# Patient Record
Sex: Female | Born: 1955 | Race: White | Hispanic: Yes | Marital: Married | Smoking: Never smoker
Health system: Southern US, Community
[De-identification: ages and names within clinical notes are randomized; demographics above are authoritative.]

## PROBLEM LIST (undated history)

## (undated) DIAGNOSIS — E781 Pure hyperglyceridemia: Secondary | ICD-10-CM

## (undated) DIAGNOSIS — R079 Chest pain, unspecified: Secondary | ICD-10-CM

## (undated) DIAGNOSIS — E782 Mixed hyperlipidemia: Secondary | ICD-10-CM

## (undated) HISTORY — PX: ABDOMINAL HYSTERECTOMY: SHX81

## (undated) HISTORY — DX: Mixed hyperlipidemia: E78.2

## (undated) HISTORY — DX: Chest pain, unspecified: R07.9

## (undated) HISTORY — DX: Pure hyperglyceridemia: E78.1

## (undated) HISTORY — PX: CHOLECYSTECTOMY: SHX55

---

## 1997-11-16 ENCOUNTER — Other Ambulatory Visit: Admission: RE | Admit: 1997-11-16 | Discharge: 1997-11-16 | Payer: Self-pay | Admitting: Gynecology

## 1998-05-19 ENCOUNTER — Inpatient Hospital Stay (HOSPITAL_COMMUNITY): Admission: AD | Admit: 1998-05-19 | Discharge: 1998-05-22 | Payer: Self-pay | Admitting: Gynecology

## 1998-06-29 ENCOUNTER — Other Ambulatory Visit: Admission: RE | Admit: 1998-06-29 | Discharge: 1998-06-29 | Payer: Self-pay | Admitting: Gynecology

## 1999-01-06 ENCOUNTER — Encounter: Payer: Self-pay | Admitting: Gynecology

## 1999-01-11 ENCOUNTER — Encounter (INDEPENDENT_AMBULATORY_CARE_PROVIDER_SITE_OTHER): Payer: Self-pay

## 1999-01-11 ENCOUNTER — Inpatient Hospital Stay (HOSPITAL_COMMUNITY): Admission: RE | Admit: 1999-01-11 | Discharge: 1999-01-14 | Payer: Self-pay | Admitting: Gynecology

## 1999-01-11 ENCOUNTER — Encounter (INDEPENDENT_AMBULATORY_CARE_PROVIDER_SITE_OTHER): Payer: Self-pay | Admitting: Specialist

## 2000-03-25 ENCOUNTER — Other Ambulatory Visit: Admission: RE | Admit: 2000-03-25 | Discharge: 2000-03-25 | Payer: Self-pay | Admitting: Gynecology

## 2001-08-06 ENCOUNTER — Other Ambulatory Visit: Admission: RE | Admit: 2001-08-06 | Discharge: 2001-08-06 | Payer: Self-pay | Admitting: Gynecology

## 2001-08-07 ENCOUNTER — Encounter: Admission: RE | Admit: 2001-08-07 | Discharge: 2001-08-07 | Payer: Self-pay | Admitting: Gynecology

## 2001-08-07 ENCOUNTER — Encounter: Payer: Self-pay | Admitting: Gynecology

## 2002-08-11 ENCOUNTER — Other Ambulatory Visit: Admission: RE | Admit: 2002-08-11 | Discharge: 2002-08-11 | Payer: Self-pay | Admitting: Gynecology

## 2003-03-25 ENCOUNTER — Other Ambulatory Visit: Admission: RE | Admit: 2003-03-25 | Discharge: 2003-03-25 | Payer: Self-pay | Admitting: Internal Medicine

## 2003-11-25 ENCOUNTER — Other Ambulatory Visit: Admission: RE | Admit: 2003-11-25 | Discharge: 2003-11-25 | Payer: Self-pay | Admitting: Gynecology

## 2004-09-12 ENCOUNTER — Ambulatory Visit (HOSPITAL_COMMUNITY): Admission: RE | Admit: 2004-09-12 | Discharge: 2004-09-12 | Payer: Self-pay | Admitting: Gastroenterology

## 2004-09-12 ENCOUNTER — Encounter (INDEPENDENT_AMBULATORY_CARE_PROVIDER_SITE_OTHER): Payer: Self-pay | Admitting: *Deleted

## 2010-01-01 ENCOUNTER — Emergency Department (HOSPITAL_COMMUNITY): Admission: EM | Admit: 2010-01-01 | Discharge: 2010-01-01 | Payer: Self-pay | Admitting: Emergency Medicine

## 2010-01-31 ENCOUNTER — Ambulatory Visit (HOSPITAL_COMMUNITY): Admission: RE | Admit: 2010-01-31 | Discharge: 2010-01-31 | Payer: Self-pay | Admitting: Surgery

## 2010-07-06 LAB — CBC
Platelets: 220 10*3/uL (ref 150–400)
RBC: 4.71 MIL/uL (ref 3.87–5.11)
RDW: 13.1 % (ref 11.5–15.5)
WBC: 6.9 10*3/uL (ref 4.0–10.5)

## 2010-07-06 LAB — COMPREHENSIVE METABOLIC PANEL
AST: 27 U/L (ref 0–37)
Albumin: 4.2 g/dL (ref 3.5–5.2)
Alkaline Phosphatase: 74 U/L (ref 39–117)
Chloride: 108 mEq/L (ref 96–112)
GFR calc Af Amer: >60 mL/min (ref >60)
Potassium: 3.5 mEq/L (ref 3.5–5.1)
Total Bilirubin: 1.4 mg/dL — ABNORMAL HIGH (ref 0.3–1.2)
Total Protein: 7.9 g/dL (ref 6.0–8.3)

## 2010-07-06 LAB — URINE MICROSCOPIC-ADD ON

## 2010-07-06 LAB — URINALYSIS, ROUTINE W REFLEX MICROSCOPIC
Glucose, UA: NEGATIVE mg/dL
Leukocytes, UA: NEGATIVE
Specific Gravity, Urine: 1.003 — ABNORMAL LOW (ref 1.005–1.030)
pH: 6.5 (ref 5.0–8.0)

## 2010-07-06 LAB — SURGICAL PCR SCREEN: Staphylococcus aureus: INVALID — AB

## 2010-07-06 LAB — MRSA CULTURE

## 2010-07-06 LAB — DIFFERENTIAL
Basophils Absolute: 0.1 10*3/uL (ref 0.0–0.1)
Basophils Relative: 1 % (ref 0–1)
Eosinophils Relative: 2 % (ref 0–5)
Lymphocytes Relative: 54 % — ABNORMAL HIGH (ref 12–46)
Monocytes Absolute: 0.7 10*3/uL (ref 0.1–1.0)
Monocytes Relative: 10 % (ref 3–12)
Neutro Abs: 2.3 10*3/uL (ref 1.7–7.7)

## 2011-07-06 IMAGING — US US ABDOMEN COMPLETE
1 series · 13 of 25 positions shown · non-contrast
Comparison: None

CLINICAL DATA: Abdominal pain.

ABDOMINAL ULTRASOUND COMPLETE

[Series 1: us abdomen complete · 0.30mm/px · 13 of 77 slices shown]
[im 1/77]
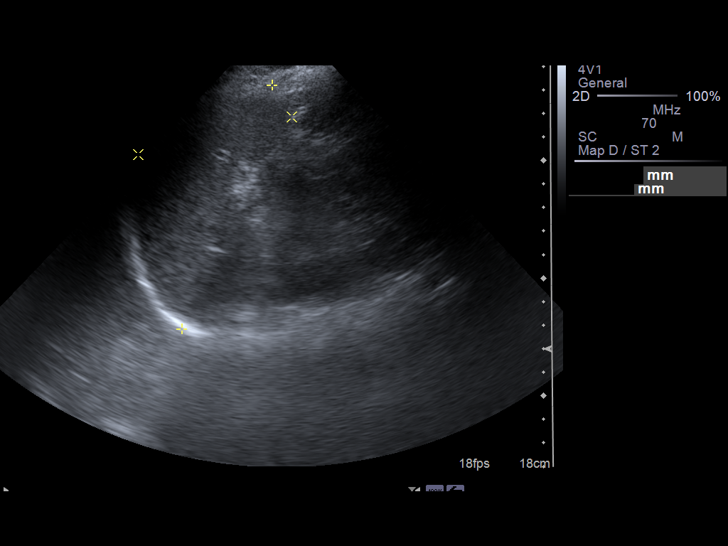
[im 7/77]
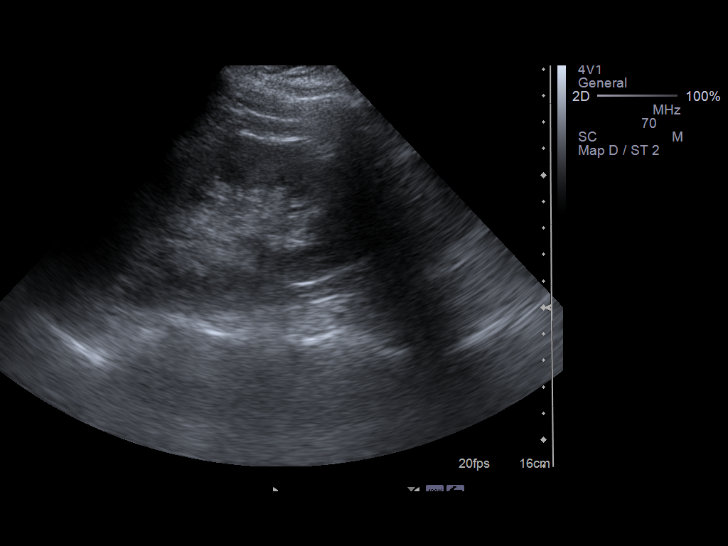
[im 13/77]
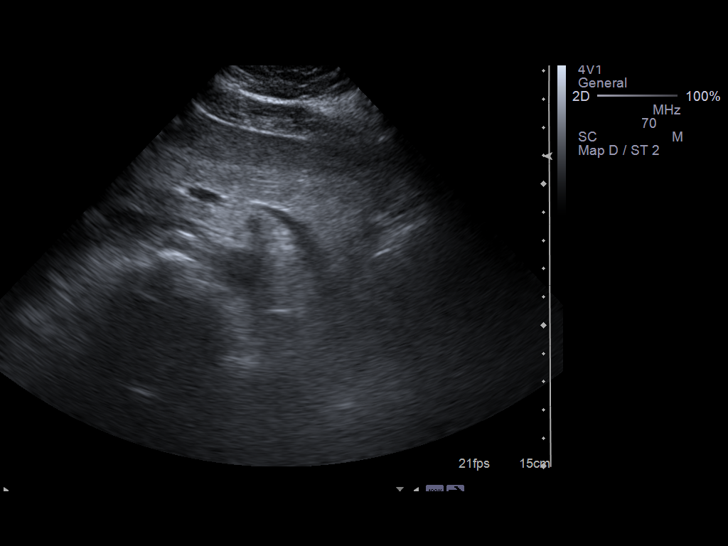
[im 20/77]
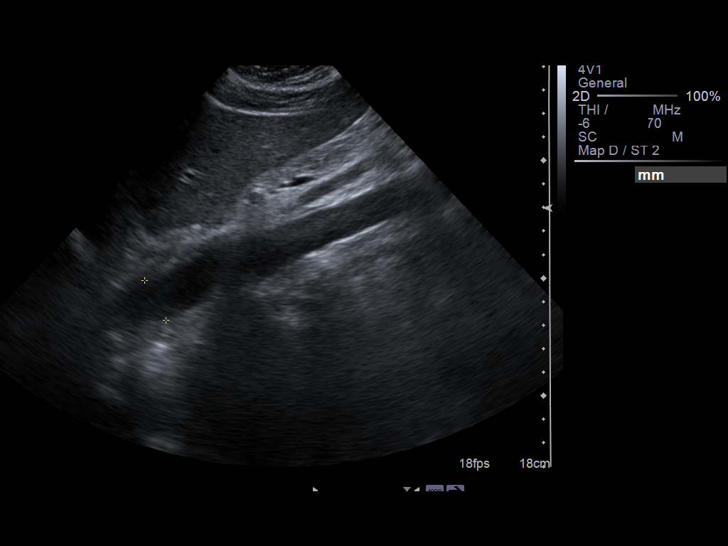
[im 26/77]
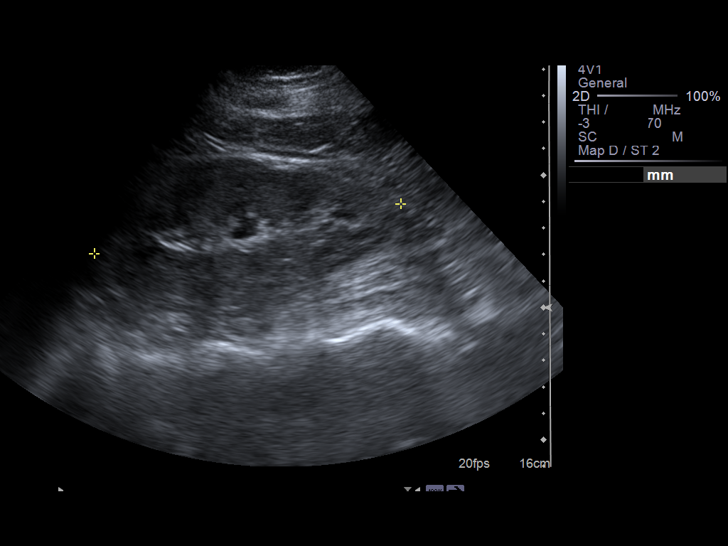
[im 32/77]
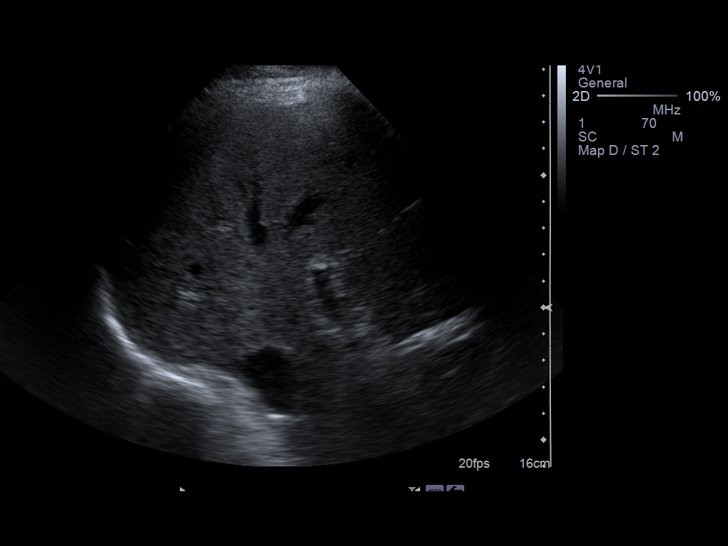
[im 39/77]
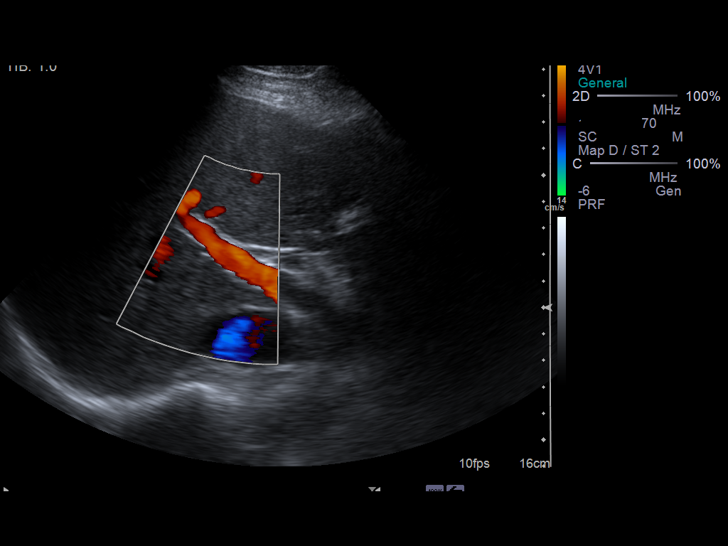
[im 45/77]
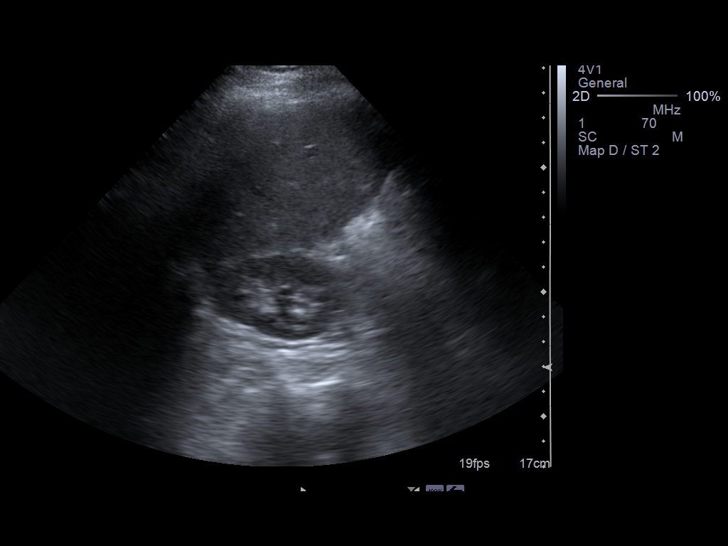
[im 51/77]
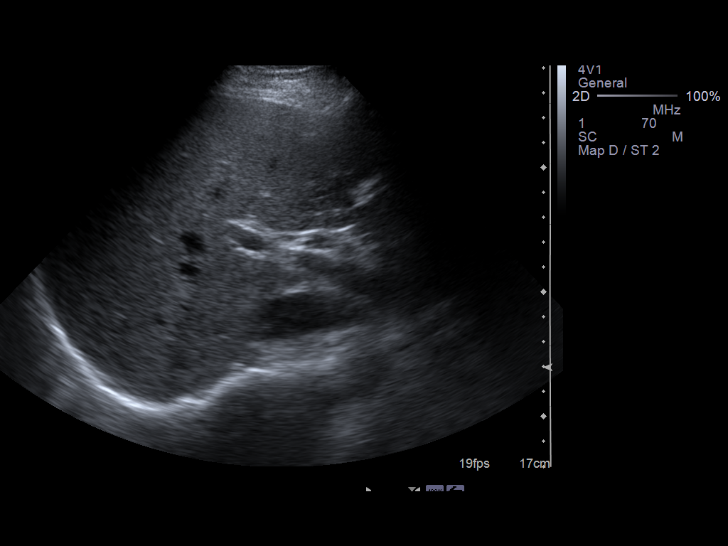
[im 58/77]
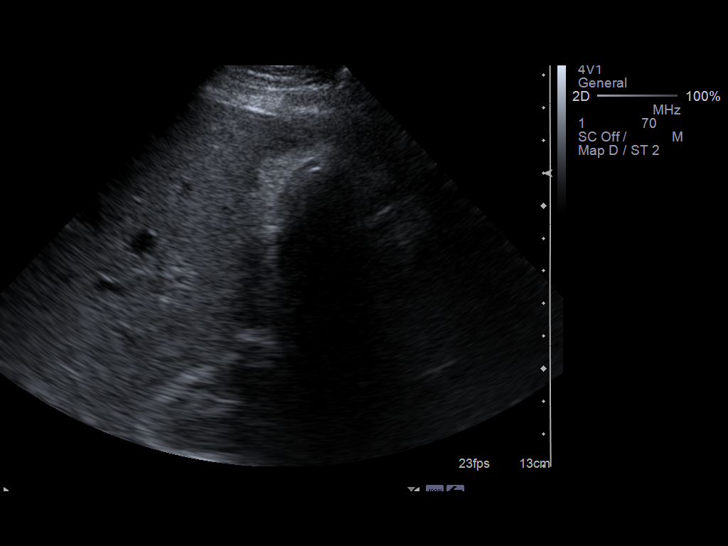
[im 64/77]
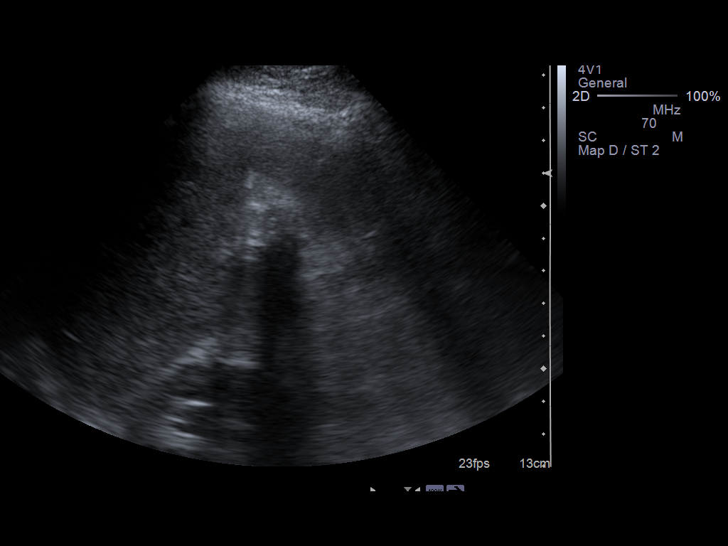
[im 70/77]
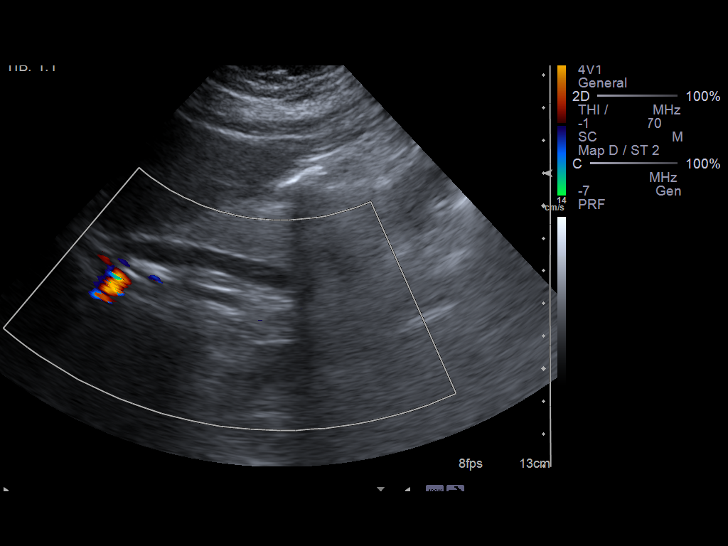
[im 77/77]
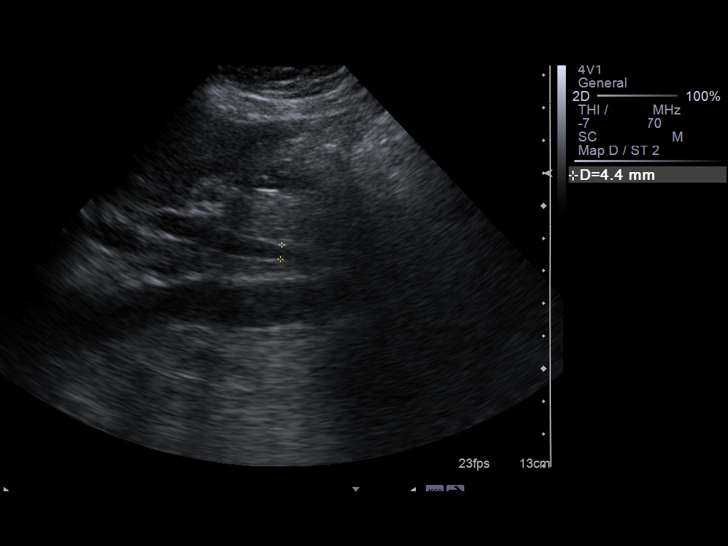

[13 of 25 positions shown; findings below may reference images not displayed]

FINDINGS: Gallbladder:  The gallbladder is filled with stones. Upper limits
normal gallbladder wall thickness is identified.  There is no
evidence of sonographic Murphy's sign or pericholecystic fluid.

Common Bile Duct:  There is no evidence of intrahepatic or
extrahepatic biliary dilation. The CBD measures 5.1 mm in greatest
diameter.

Liver:  The liver is within normal limits in parenchymal
echogenicity. No focal abnormalities are identified.

IVC:  Appears normal.

Pancreas:  Although the pancreas is difficult to visualize in its
entirety, no focal pancreatic abnormality is identified.

Spleen:  Within normal limits in size and echotexture.

Right kidney:  The right kidney is normal in size and parenchymal
echogenicity.  There is no evidence of solid mass, hydronephrosis
or definite renal calculi.  The right kidney measures 11.7 cm.

Left kidney:  The left kidney is normal in size and parenchymal
echogenicity.  There is no evidence of solid mass, hydronephrosis
or definite renal calculi.   The left kidney measures 10.5 cm.

Abdominal Aorta:  No abdominal aortic aneurysm identified.

There is no evidence of ascites.
IMPRESSION: Cholelithiasis with upper limits normal gallbladder wall thickness
- equivocal for acute cholecystitis but there is no evidence of
sonographic Murphy's sign or pericholecystic fluid.  If there is
strong clinical suspicion for acute cholecystitis, consider nuclear
medicine study.

## 2011-08-05 IMAGING — RF DG CHOLANGIOGRAM OPERATIVE
1 series · 4 of 4 positions shown · non-contrast
Comparison: None

CLINICAL DATA: Cholelithiasis

INTRAOPERATIVE CHOLANGIOGRAM
TECHNIQUE: Cholangiographic images from the C-arm fluoroscopic
device were submitted for interpretation post-operatively.  Please
see the procedural report for the amount of contrast and the
fluoroscopy time utilized.

[Series 1: run · 4 of 137 frames shown]
[frame 21/137]
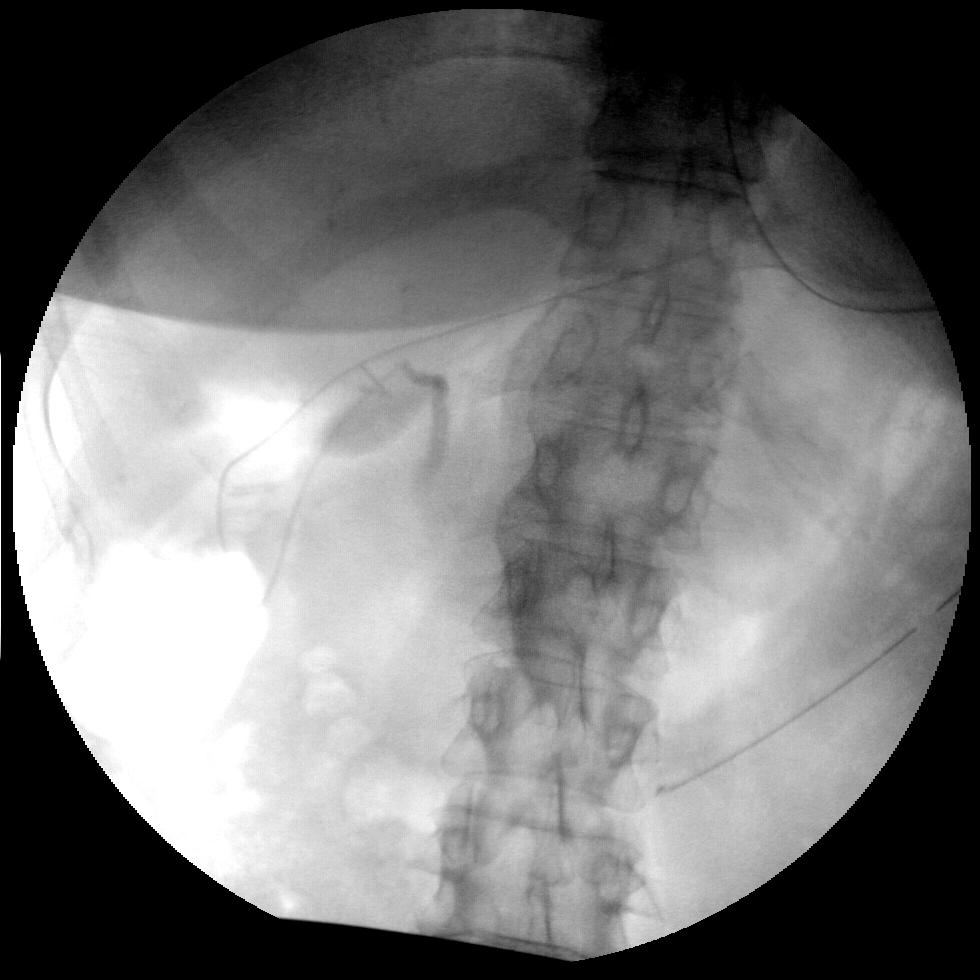
[frame 69/137]
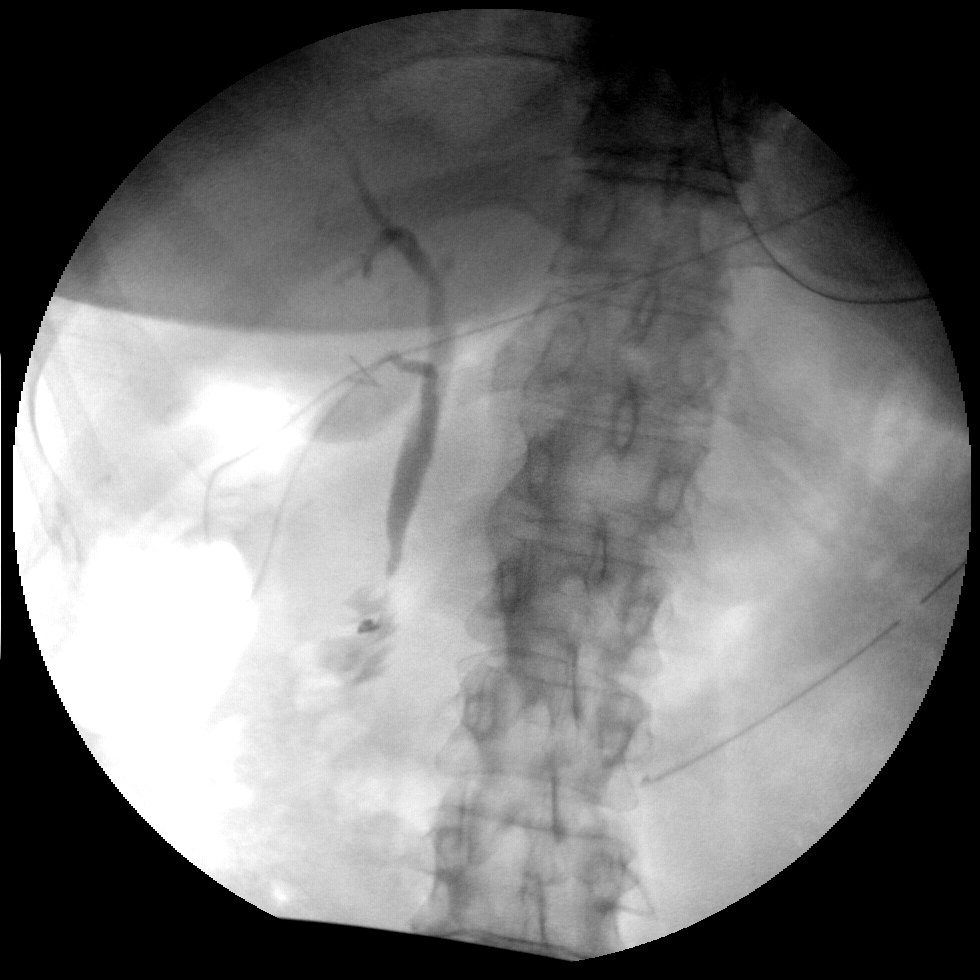
[frame 79/137]
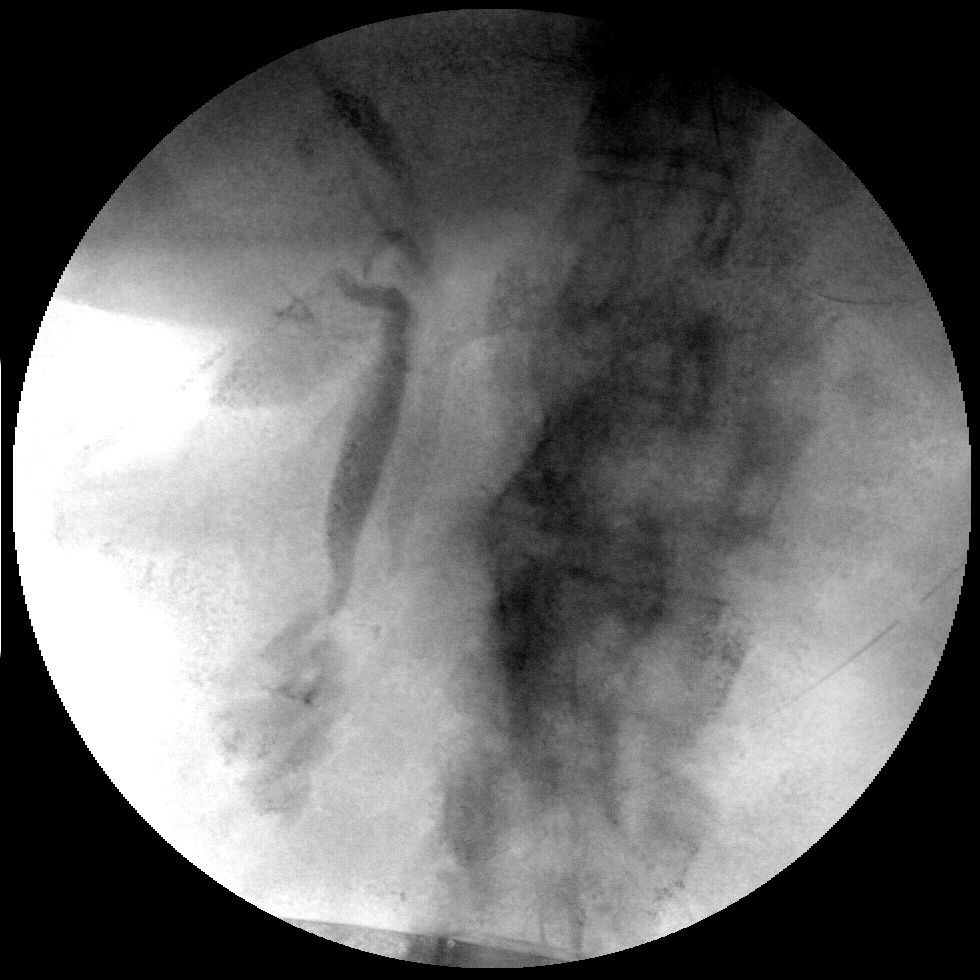
[frame 117/137]
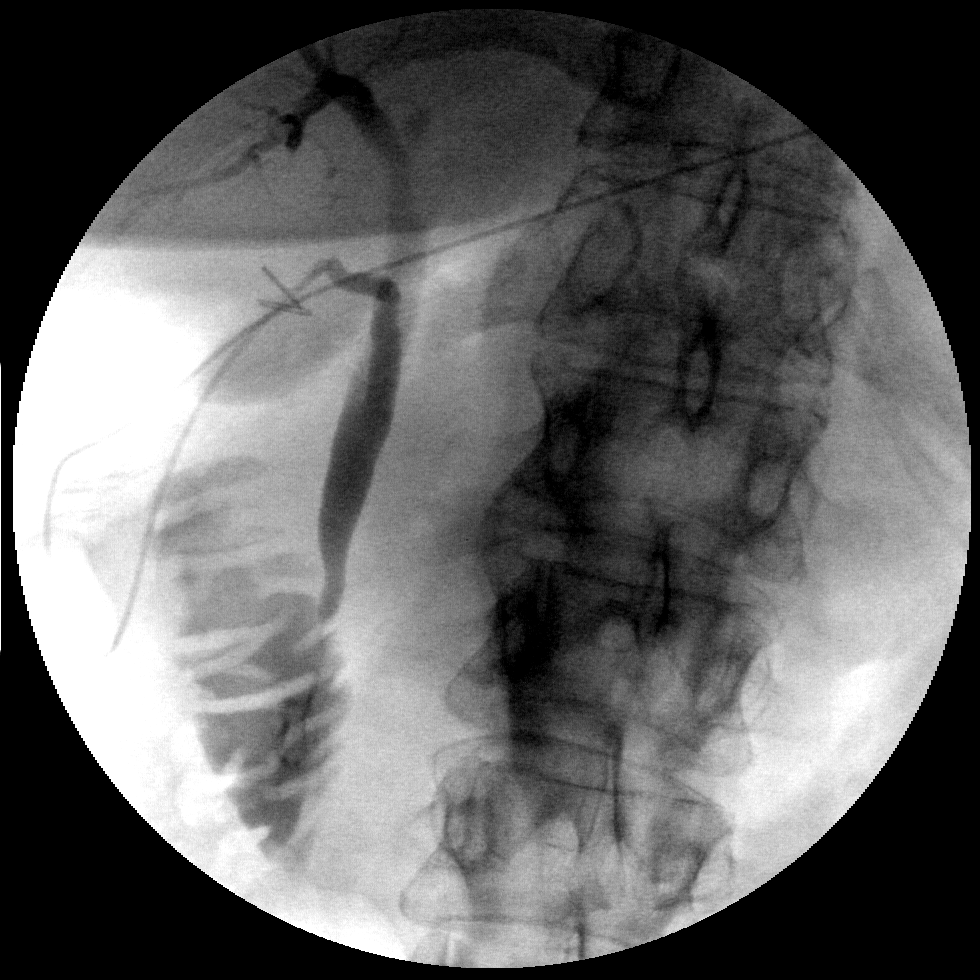

[4 of 4 positions shown; findings below may reference images not displayed]

FINDINGS: No persistent filling defects in the common duct.
Intrahepatic ducts are incompletely visualized, appearing
decompressed centrally. Contrast passes into the duodenum.

IMPRESSION

Negative for retained common duct stone.

## 2011-09-10 ENCOUNTER — Other Ambulatory Visit: Payer: Self-pay | Admitting: Family Medicine

## 2011-09-10 ENCOUNTER — Other Ambulatory Visit (HOSPITAL_COMMUNITY): Payer: Self-pay | Admitting: Geriatric Medicine

## 2011-09-10 DIAGNOSIS — Z1231 Encounter for screening mammogram for malignant neoplasm of breast: Secondary | ICD-10-CM

## 2011-09-11 ENCOUNTER — Ambulatory Visit (HOSPITAL_COMMUNITY)
Admission: RE | Admit: 2011-09-11 | Discharge: 2011-09-11 | Disposition: A | Discharge status: Home / Self Care | Payer: Self-pay | Source: Ambulatory Visit | Attending: Geriatric Medicine | Admitting: Geriatric Medicine

## 2011-09-11 DIAGNOSIS — Z1231 Encounter for screening mammogram for malignant neoplasm of breast: Secondary | ICD-10-CM

## 2013-09-08 ENCOUNTER — Ambulatory Visit (INDEPENDENT_AMBULATORY_CARE_PROVIDER_SITE_OTHER): Payer: BC Managed Care – PPO | Admitting: Family Medicine

## 2013-09-08 VITALS — BP 132/70 | HR 68 | Temp 98.3°F | Resp 16 | Ht 60.5 in | Wt 151.2 lb

## 2013-09-08 DIAGNOSIS — F411 Generalized anxiety disorder: Secondary | ICD-10-CM

## 2013-09-08 DIAGNOSIS — R51 Headache: Secondary | ICD-10-CM

## 2013-09-08 DIAGNOSIS — F4310 Post-traumatic stress disorder, unspecified: Secondary | ICD-10-CM

## 2013-09-08 MED ORDER — TRAZODONE HCL 100 MG PO TABS: 100.0000 mg | ORAL_TABLET | Freq: Every day | ORAL | Status: DC
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
## 2013-09-08 NOTE — Patient Instructions (Signed)
Trastorno por estrs postraumtico ( Posttraumatic Stress Disorder) El trastorno por estrs postraumtico (TEPT) es un trastorno mental. Se produce despus de vivir un evento traumtico. Los eventos traumticos que causan un TEPT no se corresponden con las experiencias normales de los seres Sycamorehumanos. Algunos ejemplos de estos eventos incluyen una guerra, accidentes automovilsticos, desastres naturales, una violacin, violencia domstica y delitos violentos. La mayora de las personas que experimentan este tipo de eventos son capaces de curarse por s solas. Aquellas que no se curan desarrollan el TEPT. Cualquier persona puede desarrollar el TEPT a cualquier edad. Sin embargo, las Dealerpersonas con antecedentes de abuso en la niez presentan un mayor riesgo de Statisticiandesarrollar TEPT.  SNTOMAS  El evento traumtico que causa el TEPT debe ser una amenaza para la vida, causar lesiones graves o involucrar la violencia sexual. Por lo general, lo experimenta, en forma directa, la persona que desarrolla TEPT. En ocasiones, las personas que sufren un TEPT presencian eventos traumticos que suceden a otros o se enteran de un evento traumtico, que le sucede a un miembro cercano de la familia o a Leisure centre managerun amigo. Las siguientes conductas son caractersticas de las personas con TEPT:  Las personas con TEPT vuelven a experimentar el evento traumtico en una o ms de las siguientes maneras (sntomas de intrusin):  Recuerdos angustiantes no deseados recurrentes mientras se encuentra despierto.  Sueos recurrentes y angustiantes.  Sensaciones similares a las que se percibieron cuando ocurri originalmente el evento (escenas retrospectivas).  Angustia emocional intensa o prolongada provocada por recuerdos del evento traumtico, que puede incluir temor, horror, tristeza profunda o enojo.  Reacciones fsicas marcadas, provocadas por recuerdos del evento traumtico, que pueden incluir ritmo acelerado del corazn, falta de aire,  sudoracin y temblores.  Las personas con TEPT evitan pensamientos, conversaciones, personas o actividades que les recuerden el evento postraumtico (sntomas de evasin).  Sus pensamientos y [place]estado de nimo Kuwaitcambian de manera negativa despus de un evento traumtico. Estos cambios incluyen:  Incapacidad de recordar uno o ms aspectos significativos del evento traumtico (lagunas de memoria).  Percepciones negativas y Teaching laboratory technicianexageradas acerca de s mismo o de otros, como creer que son Optometristmalas personas o que nadie puede ser confiable.  Culparse a s mismo o a otros por el evento traumtico.  Estado emocional negativo constante, como temor, horror, enojo, tristeza, culpa o vergenza.  Notable disminucin de su participacin o del inters en actividades importantes.  Prdida de conexin con Nucor Corporationotras personas.  Incapacidad de experimentar emociones positivas, como felicidad o amor.  Las personas con TEPT son ms sensibles a su entorno y Dispensing opticianreaccionan ms fcilmente que otras (sntomas de alteracin de Music therapistla alerta y reactividad): Estos sntomas incluyen:  Irritabilidad, con arrebatos de ira hacia otras personas u objetos. Los arrebatos se desencadenan fcilmente y pueden ser verbales o fsicos.  Conducta negligente o autodestructiva, que puede incluir imprudencia al conducir o consumo de drogas.  Una sensacin de nerviosismo, con un mayor sentido de Control and instrumentation engineeralerta (hipervigilancia).  Reacciones exageradas a estmulos, como asustarse con facilidad.  Dificultad para concentrarse.  Dificultad para dormir. Los sntomas del TEPT pueden comenzar poco despus del evento atemorizante o meses o aos ms tarde. Duran 1 mes o ms y Development worker, communitypueden afectar una o ms reas de funcionamiento, como el rea social u ocupacional.  DIAGNSTICO  El TEPT se diagnostica mediante una evaluacin realizada por un profesional de salud mental. Se le realizarn preguntas sobre los eventos traumticos en su vida. Tambin se le preguntar cmo estos  eventos han cambiado sus pensamientos,  estado de nimo, conducta y capacidad de funcionamiento a diario. Es posible que se le hagan preguntas sobre el consumo de alcohol o drogas, que puede empeorar los sntomas del TEPT. TRATAMIENTO  A diferencia de muchos trastornos mentales, que requieren un tratamiento de por vida, el TEPT es una afeccin curable. El objetivo del tratamiento es Psychologist, occupationalneutralizar los efectos negativos del evento traumtico en el funcionamiento diario, no borrar el recuerdo del evento. Los siguientes tratamientos pueden indicarse para Pharmacist, communityalcanzar este objetivo.  Medicamentos: ciertos medicamentos pueden reducir algunos sntomas del TEPT. Los sntomas de intrusin y de alteracin de la alerta y reactividad responden mejor a los medicamentos.  Orientacin (psicoterapia): la psicoterapia con un profesional de salud mental, que tiene experiencia en el tratamiento del TEPT, puede ayudar. La psicoterapia puede proporcionar educacin, apoyo emocional y habilidades para enfrentar el problema. Ciertos tipos de psicoterapia que tratan, especficamente, eventos traumticos constituyen el tratamiento ms efectivo para el TEPT:  Terapia de exposicin prolongada, que implica recordar y Risk analystprocesar el evento traumtico con un terapeuta, en un entorno seguro, hasta que ya no provoque una respuesta emocional negativa.  Terapia de desensibilizacin y reprocesamiento por movimientos oculares, que implica el uso reiterado de estmulos fsicos de los sentidos que alterna entre los lados derecho e izquierdo del cuerpo. Se cree que esta terapia facilita la comunicacin Thrivent Financialentre los dos lados del cerebro. La comunicacin ayuda a la mente a Engineer, petroleumintegrar los recuerdos fragmentados del evento traumtico en una historia completa que tiene sentido y ya no genera una respuesta emocional negativa. La Harley-Davidsonmayora de las personas con TEPT se benefician con una combinacin de estos tratamientos.  Document Released: 01/17/2005 Document  Revised: 07/02/2011 [facility] Patient Information 2014 [place], [place].

## 2013-10-12 ENCOUNTER — Ambulatory Visit (INDEPENDENT_AMBULATORY_CARE_PROVIDER_SITE_OTHER): Payer: BC Managed Care – PPO | Admitting: Family Medicine

## 2013-10-12 VITALS — BP 116/80 | HR 88 | Temp 98.9°F | Resp 16 | Ht 62.0 in | Wt 149.9 lb

## 2013-10-12 DIAGNOSIS — M79609 Pain in unspecified limb: Secondary | ICD-10-CM

## 2013-10-12 DIAGNOSIS — E7800 Pure hypercholesterolemia, unspecified: Secondary | ICD-10-CM

## 2013-10-12 DIAGNOSIS — M79602 Pain in left arm: Secondary | ICD-10-CM

## 2013-10-12 DIAGNOSIS — F411 Generalized anxiety disorder: Secondary | ICD-10-CM

## 2013-10-12 LAB — COMPREHENSIVE METABOLIC PANEL
ALT: 15 U/L (ref 0–35)
AST: 21 U/L (ref 0–37)
Albumin: 4.8 g/dL (ref 3.5–5.2)
Alkaline Phosphatase: 90 U/L (ref 39–117)
BUN: 10 mg/dL (ref 6–23)
CALCIUM: 9.9 mg/dL (ref 8.4–10.5)
CHLORIDE: 103 meq/L (ref 96–112)
CO2: 26 meq/L (ref 19–32)
CREATININE: 0.59 mg/dL (ref 0.50–1.10)
GLUCOSE: 107 mg/dL — AB (ref 70–99)
Potassium: 4.1 mEq/L (ref 3.5–5.3)
Sodium: 137 mEq/L (ref 135–145)
Total Bilirubin: 1 mg/dL (ref 0.2–1.2)
Total Protein: 8 g/dL (ref 6.0–8.3)

## 2013-10-12 LAB — POCT CBC
Granulocyte percent: 58.6 %G (ref 37–80)
HCT, POC: 42.2 % (ref 37.7–47.9)
HEMOGLOBIN: 13.7 g/dL (ref 12.2–16.2)
Lymph, poc: 2.3 (ref 0.6–3.4)
MCH: 28.7 pg (ref 27–31.2)
MCHC: 32.5 g/dL (ref 31.8–35.4)
MCV: 88.5 fL (ref 80–97)
MID (CBC): 0.5 (ref 0–0.9)
MPV: 8.1 fL (ref 0–99.8)
PLATELET COUNT, POC: 302 10*3/uL (ref 142–424)
POC Granulocyte: 3.9 (ref 2–6.9)
POC LYMPH PERCENT: 33.6 %L (ref 10–50)
POC MID %: 7.8 %M (ref 0–12)
RBC: 4.77 M/uL (ref 4.04–5.48)
RDW, POC: 14.3 %
WBC: 6.7 10*3/uL (ref 4.6–10.2)

## 2013-10-12 LAB — LIPID PANEL
CHOL/HDL RATIO: 4.4 ratio
CHOLESTEROL: 211 mg/dL — AB (ref 0–200)
HDL: 48 mg/dL (ref >39)
LDL Cholesterol: 128 mg/dL — ABNORMAL HIGH (ref 0–99)
TRIGLYCERIDES: 173 mg/dL — AB (ref <150)
VLDL: 35 mg/dL (ref 0–40)

## 2013-10-12 MED ORDER — HYDROXYZINE HCL 25 MG PO TABS: 12.5000 mg | ORAL_TABLET | Freq: Three times a day (TID) | ORAL | Status: DC | PRN
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
## 2013-10-12 NOTE — Patient Instructions (Signed)
For shoulder pain-  Ibuprofen or acetaminophen every 8 hours as needed for pain.  Heat to the area

## 2014-06-09 ENCOUNTER — Other Ambulatory Visit (HOSPITAL_COMMUNITY): Payer: Self-pay | Admitting: Specialist

## 2014-06-09 DIAGNOSIS — Z1231 Encounter for screening mammogram for malignant neoplasm of breast: Secondary | ICD-10-CM

## 2014-06-10 ENCOUNTER — Ambulatory Visit (HOSPITAL_COMMUNITY)
Admission: RE | Admit: 2014-06-10 | Discharge: 2014-06-10 | Disposition: A | Discharge status: Home / Self Care | Payer: 59 | Source: Ambulatory Visit | Attending: Specialist | Admitting: Specialist

## 2014-06-10 DIAGNOSIS — Z1231 Encounter for screening mammogram for malignant neoplasm of breast: Secondary | ICD-10-CM | POA: Insufficient documentation

## 2015-10-07 ENCOUNTER — Other Ambulatory Visit: Payer: Self-pay | Admitting: Obstetrics and Gynecology

## 2015-10-07 DIAGNOSIS — Z1231 Encounter for screening mammogram for malignant neoplasm of breast: Secondary | ICD-10-CM

## 2015-10-14 ENCOUNTER — Ambulatory Visit
Admission: RE | Admit: 2015-10-14 | Discharge: 2015-10-14 | Disposition: A | Discharge status: Home / Self Care | Payer: No Typology Code available for payment source | Source: Ambulatory Visit | Attending: Obstetrics and Gynecology | Admitting: Obstetrics and Gynecology

## 2015-10-14 ENCOUNTER — Ambulatory Visit (HOSPITAL_COMMUNITY)
Admission: RE | Admit: 2015-10-14 | Discharge: 2015-10-14 | Disposition: A | Discharge status: Home / Self Care | Payer: Self-pay | Source: Ambulatory Visit | Attending: Obstetrics and Gynecology | Admitting: Obstetrics and Gynecology

## 2015-10-14 ENCOUNTER — Encounter (HOSPITAL_COMMUNITY): Payer: Self-pay

## 2015-10-14 VITALS — BP 124/82 | Temp 98.2°F | Ht 62.0 in | Wt 154.2 lb

## 2015-10-14 DIAGNOSIS — Z1239 Encounter for other screening for malignant neoplasm of breast: Secondary | ICD-10-CM

## 2015-10-14 DIAGNOSIS — Z1231 Encounter for screening mammogram for malignant neoplasm of breast: Secondary | ICD-10-CM

## 2015-10-14 NOTE — Progress Notes (Signed)
No complaints today.   Pap Smear:  Pap smear not completed today. [place] and normal per patient. Per patient has no history of an abnormal Pap smear. Patient has a history of a hysterectomy 01/11/1999 due to cyst. Patient no longer needs Pap smears due to her history of a hysterectomy for benign reasons per ACOG and BCCCP guidelines. No Pap smear results are in EPIC.  Physical exam: Breasts Breasts symmetrical. No skin abnormalities bilateral breasts. Fatty area observed right axilla that per patient has been there for years and mammogram 06/02/2014 was negative. Per patient no changes have occurred since last mammogram. No nipple retraction bilateral breasts. No nipple discharge bilateral breasts. No lymphadenopathy. No lumps palpated bilateral breasts. No complaints of pain or tenderness on exam. [facility] Appointment scheduled for Friday, October 14, 2015 at 0910.        Pelvic/Bimanual No Pap smear completed today since patient has a history of a hysterectomy for benign reasons. Pap smear not indicated per BCCCP guidelines.   Smoking History: Patient has never smoked.  Patient Navigation: Patient education provided. [facility]. Spanish interpreter provided.  Colorectal Cancer Screening: Patient has never had a colonoscopy. No complaints today.  Used Spanish interpreter [employer]iel Gallego [place].

## 2015-10-14 NOTE — Patient Instructions (Signed)
Educational materials on self breast awareness given. Explained to Betty Bradley that she did not need a Pap smear today due to her history of a hysterectomy for benign reasons. Let patient know that she doesn't need any further Pap smears due to her history of a hysterectomy for benign reasons. [facility]. Appointment scheduled for Friday, October 14, 2015 at 0910. Let patient know the Breast Center will follow up with her within the next couple weeks with results of mammogram by letter or phone. Betty Bradley verbalized understanding.  Akasia Ahmad, Kathaleen Maserhristine Poll, RN 9:39 AM

## 2015-10-17 ENCOUNTER — Encounter (HOSPITAL_COMMUNITY): Payer: Self-pay | Admitting: *Deleted

## 2015-10-18 ENCOUNTER — Other Ambulatory Visit: Payer: Self-pay | Admitting: Obstetrics and Gynecology

## 2015-10-18 DIAGNOSIS — R928 Other abnormal and inconclusive findings on diagnostic imaging of breast: Secondary | ICD-10-CM

## 2015-10-21 ENCOUNTER — Ambulatory Visit: Payer: Self-pay

## 2015-10-21 ENCOUNTER — Other Ambulatory Visit: Payer: Self-pay

## 2015-10-21 VITALS — BP 102/76 | HR 62 | Temp 97.7°F | Resp 18 | Ht 61.02 in | Wt 151.8 lb

## 2015-10-21 DIAGNOSIS — Z0000 Encounter for general adult medical examination without abnormal findings: Secondary | ICD-10-CM

## 2015-10-21 NOTE — Progress Notes (Signed)
[facility] and is currently a [facility] patient effective 10/14/2015. Patient was accompanied by CAP worker and daughter. CAP worker had to leave at 9:20 so Vernetta Honeyoritas Arias interpreter complete the interpretering.  Clinical Measurements: Patient is 5 ft. 1 inches, weight 151.8 lbs, and BMI 28.7.   Medical History: Patient has no history of high cholesterol though has had elevated triglycerides. Patient does not have a history of hypertension or diabetes, though has been told was prediabetic.Per patient no diagnosed history of coronary heart disease, heart attack, heart failure, stroke/TIA, vascular disease or congenital heart defects.   Blood Pressure, Self-measurement: Patient states has no reason to check Blood pressure.  Nutrition Assessment: Patient stated that eats 3 fruits every day. Patient states she eats 1 serving of vegetables a day. Per patient does not eat 3 or more ounces of whole grains daily. Patient doesn't eat two or more servings of fish weekly.  Patient states she does not drink more than 36 ounces or 450 calories of beverages with added sugars weekly. Patient states she drinks a lot of water. Patient stated she does not  watch her salt intake.   Physical Activity Assessment:  Patient stated has probably been doing 120 minutes of moderate exercise a week and no vigorous exercise.  Smoking Status: Patient stated that has never smoked and is not exposed to smoke.  Quality of Life Assessment: In assessing patient's physical quality of life she stated that out of the past 30 days that she has felt her health was good all days. Patient also stated that in the past 30 days that her mental health was good including stress, depression and problems with emotions for all days. Patient did state that out of the past 30 days she felt her physical or mental health had not kept her from doing her usual activities including self-care, work or recreation for all  days.   Plan: Lab work will be done today including a lipid panel and Hgb A1C. Will call lab results when they are finished. Patient will receive Health Coaching for risk reduction initially and then as patient wants.

## 2015-10-21 NOTE — Progress Notes (Signed)
FIRST HEALTH COACH: During initial screening with Estill Cottaortias Arias interpreter present, wellness health coaching was started.  Nutrition: Discussed with patient that normally should eat 2 servings of fruits and  2 1/2 vegetables daily. Patient is eating one serving fish a week and not ones with omega 3's. Explained that really need to have 2 servings of omega 3 fish a week. Discussed the types of fish that should be eating. Discussed with patient the reason that should be eating at least 5 ounces of grains a day with 3 ounces being whole grains. Informed patient about sugar which patient is doing. Explain why need to cut down on salt and limit to 2 GM per day.  Physical Activity: Patient stated that she is not really exercising. Informed that 150 minutes of moderate  or 75 minutes of vigorous activity is the minimal activity recommended.  PLAN: Will set goal when call lab results and do second health coaching. Will think about what goals may want to set.

## 2015-10-21 NOTE — Patient Instructions (Signed)
Discussed health assessment with patient. IShe will be called with results of lab work and we will then discussed any further follow up the patient needs.Will increase activity. Patient verbalized understanding.

## 2015-10-22 LAB — LIPID PANEL
CHOLESTEROL TOTAL: 203 mg/dL — AB (ref 100–199)
Chol/HDL Ratio: 4 ratio units (ref 0.0–4.4)
HDL: 51 mg/dL (ref >39)
LDL Calculated: 125 mg/dL — ABNORMAL HIGH (ref 0–99)
TRIGLYCERIDES: 135 mg/dL (ref 0–149)
VLDL Cholesterol Cal: 27 mg/dL (ref 5–40)

## 2015-10-22 LAB — HEMOGLOBIN A1C
ESTIMATED AVERAGE GLUCOSE: 120 mg/dL
HEMOGLOBIN A1C: 5.8 % — AB (ref 4.8–5.6)

## 2015-10-26 ENCOUNTER — Telehealth: Payer: Self-pay

## 2015-10-26 NOTE — Telephone Encounter (Addendum)
LAB RESULTS: Interpreter Betty SenegalMarly Bradley called to inform patient about lab work from 10/21/15. Interpreter` informed patient: cholesterol- 203, HDL- 51, LDL- 125, triglycerides - 045135,  HBG-A1C - 5.8, and BMI 28.7.  SECOND HEALTH COACHING: Did risk reduction counseling/Heach Coach concerning related to BMI/Weight, cholesterol, LDL, and A1C.  We discussed how starches break down into sugar in the body and the need to watch size and number of servings that eat a day. Disussed how better to have starches high in fiber like brown or wild rice. We talked about importance of exercise and increasing fiber in the diet. Discussed the need to loose weight, cut back calories, serving sizes, avoid processed foods, choose healthier fats, and limit amount alcohol you drink. Examples were given of each of the past subject matter.  Patient stated that wanted to work on problem areas by herself. She asked if I would call back in a month to see how is doing and do further coaching or see if wants to come in for person to person coaching.  PLAN: Patient will put above recommendation slowly into action.  I will call patient in a month.

## 2015-11-02 ENCOUNTER — Ambulatory Visit
Admission: RE | Admit: 2015-11-02 | Discharge: 2015-11-02 | Disposition: A | Discharge status: Home / Self Care | Payer: No Typology Code available for payment source | Source: Ambulatory Visit | Attending: Obstetrics and Gynecology | Admitting: Obstetrics and Gynecology

## 2015-11-02 DIAGNOSIS — R928 Other abnormal and inconclusive findings on diagnostic imaging of breast: Secondary | ICD-10-CM

## 2015-12-08 ENCOUNTER — Telehealth: Payer: Self-pay

## 2015-12-08 NOTE — Telephone Encounter (Signed)
THIRD HEALTH COACHING Patient called per phone today for Health Coaching with interpreter Betty Bradley present. Patient's areas of concern were weight,cholesterol, LDL, A1C and exercise.  HEALTH COACHING: Patient stated had decreased the amount of bread, rice, and grease she is eating. Per patient has increased the amount of vegetables that she is eating. Patient stated that is not sure about all vegetables that are starchy. Listed vegetables such as peas, corn, potato, etc.. States that is doing like was told  But still is consuming too much fruit.Patient stated that is walking 60 minutes on most days. Per patient all of this has helped her to feel much better.  Patient stated that daughter has been diagnosis with gestational diabetes and has learned a lot from her.  PLAN:  Will continue with increasing exercise. Will continue with the good meal and nutrition changes. Will be doing follow up assessment when call next.  TIME: 15 minutes

## 2016-05-02 ENCOUNTER — Other Ambulatory Visit: Payer: Self-pay | Admitting: Obstetrics and Gynecology

## 2016-05-02 DIAGNOSIS — N63 Unspecified lump in unspecified breast: Secondary | ICD-10-CM

## 2016-05-11 ENCOUNTER — Other Ambulatory Visit: Payer: No Typology Code available for payment source

## 2016-05-18 ENCOUNTER — Ambulatory Visit
Admission: RE | Admit: 2016-05-18 | Discharge: 2016-05-18 | Disposition: A | Discharge status: Home / Self Care | Payer: No Typology Code available for payment source | Source: Ambulatory Visit | Attending: Obstetrics and Gynecology | Admitting: Obstetrics and Gynecology

## 2016-05-18 DIAGNOSIS — N63 Unspecified lump in unspecified breast: Secondary | ICD-10-CM

## 2016-06-04 ENCOUNTER — Telehealth: Payer: Self-pay

## 2016-06-04 NOTE — Telephone Encounter (Signed)
Called to follow-up with interpretor Delorise Royals(Julie Sowell) and message left.

## 2016-07-13 ENCOUNTER — Telehealth: Payer: Self-pay

## 2016-07-13 NOTE — Telephone Encounter (Signed)
Interpretor attempted to contact patient on multiple occasions for wisewoman follow up. Patient lost to follow up.

## 2016-11-28 ENCOUNTER — Other Ambulatory Visit: Payer: Self-pay | Admitting: Obstetrics and Gynecology

## 2016-11-28 DIAGNOSIS — N6320 Unspecified lump in the left breast, unspecified quadrant: Secondary | ICD-10-CM

## 2016-12-20 ENCOUNTER — Encounter (HOSPITAL_COMMUNITY): Payer: Self-pay

## 2016-12-20 ENCOUNTER — Ambulatory Visit (HOSPITAL_COMMUNITY)
Admission: RE | Admit: 2016-12-20 | Discharge: 2016-12-20 | Disposition: A | Discharge status: Home / Self Care | Payer: Self-pay | Source: Ambulatory Visit | Attending: Obstetrics and Gynecology | Admitting: Obstetrics and Gynecology

## 2016-12-20 ENCOUNTER — Ambulatory Visit: Admission: RE | Admit: 2016-12-20 | Payer: No Typology Code available for payment source | Source: Ambulatory Visit

## 2016-12-20 ENCOUNTER — Ambulatory Visit
Admission: RE | Admit: 2016-12-20 | Discharge: 2016-12-20 | Disposition: A | Discharge status: Home / Self Care | Payer: No Typology Code available for payment source | Source: Ambulatory Visit | Attending: Obstetrics and Gynecology | Admitting: Obstetrics and Gynecology

## 2016-12-20 VITALS — BP 121/71 | HR 73 | Temp 98.1°F | Ht 62.0 in | Wt 144.4 lb

## 2016-12-20 DIAGNOSIS — N6320 Unspecified lump in the left breast, unspecified quadrant: Secondary | ICD-10-CM

## 2016-12-20 DIAGNOSIS — Z1239 Encounter for other screening for malignant neoplasm of breast: Secondary | ICD-10-CM

## 2016-12-20 NOTE — Patient Instructions (Signed)
Explained breast self awareness with Betty Bradley. Patient did not need a Pap smear today due to her history of a hysterectomy for benign reasons. Let her know that she doesn't need any further Pap smears due to her history of a hysterectomy for benign reasons. [facility] for a bilateral diagnostic mammogram and left breast ultrasound per recommendation. Appointment scheduled for Thursday, December 20, 2016 at 1050.  Betty Bradley verbalized understanding.  Brannock, Kathaleen Maserhristine Poll, RN 11:33 AM

## 2016-12-20 NOTE — Progress Notes (Signed)
[facility] due to recommending left breast six month follow up. Last left breast diagnostic mammogram and ultrasound completed 05/18/2016.  Pap Smear: Pap smear not completed today. Last Pap smear was in 2016 in [place] and normal per patient. Per patient has no history of an abnormal Pap smear. Patient has a history of a hysterectomy 01/11/1999 due to cyst. Patient no longer needs Pap smears due to her history of a hysterectomy for benign reasons per ACOG and BCCCP guidelines. No Pap smear results are in EPIC.  Physical exam: Breasts Breasts symmetrical. No skin abnormalities bilateral breasts. Fatty area observed right axilla that per patient has been there for years. Per patient no changes have occurred since last mammogram. No nipple retraction bilateral breasts. No nipple discharge bilateral breasts. No lymphadenopathy. No lumps palpated bilateral breasts. No complaints of pain or tenderness on exam. [facility] for a bilateral diagnostic mammogram and left breast ultrasound per recommendation. Appointment scheduled for Thursday, December 20, 2016 at 1050.      Pelvic/Bimanual No Pap smear completed today since patient has a history of a hysterectomy for benign reasons. Pap smear not indicated per BCCCP guidelines.   Smoking History: Patient has never smoked.  Patient Navigation: Patient education provided. Access to services provided for [facility]. Spanish interpreter provided.  Colorectal Cancer Screening: Per patient has never had a colonoscopy. No complaints today. FIT Test given to patient to complete and return to BCCCP.  Used Spanish interpreter [employer] Lindner from [employer].

## 2017-02-06 ENCOUNTER — Encounter (HOSPITAL_COMMUNITY): Payer: Self-pay

## 2017-06-26 ENCOUNTER — Other Ambulatory Visit (HOSPITAL_COMMUNITY): Payer: Self-pay

## 2017-06-26 DIAGNOSIS — Z0000 Encounter for general adult medical examination without abnormal findings: Secondary | ICD-10-CM

## 2017-06-27 NOTE — Addendum Note (Signed)
Addended by: Daleena Rotter on: 06/27/2017 03:11 PM   Modules accepted: Orders  

## 2017-06-27 NOTE — Addendum Note (Signed)
Addended by: Kathreen CornfieldSMITH, Brittlyn Cloe on: 06/27/2017 03:11 PM   Modules accepted: Orders

## 2017-06-28 ENCOUNTER — Ambulatory Visit: Payer: No Typology Code available for payment source

## 2017-06-28 ENCOUNTER — Other Ambulatory Visit: Payer: No Typology Code available for payment source

## 2017-12-02 ENCOUNTER — Telehealth (HOSPITAL_COMMUNITY): Payer: Self-pay | Admitting: *Deleted

## 2017-12-02 NOTE — Telephone Encounter (Signed)
Called patient with Spanish interpreter Nira ConnJulia Sowell to remind patient that she is due for her follow-up diagnostic mammogram in September 2019 and that if still eligible will need to renew her BCCCP. Patient stated she followed-up in [place] in May 2019 and was told everything was fine. Patient stated she has her films and report. Patient stated she will take to the Breast Center.

## 2019-07-25 ENCOUNTER — Ambulatory Visit: Payer: Self-pay | Attending: Internal Medicine

## 2019-07-25 DIAGNOSIS — Z23 Encounter for immunization: Secondary | ICD-10-CM

## 2019-07-25 NOTE — Progress Notes (Signed)
   Covid-19 Vaccination Clinic  Name:  Betty Bradley    MRN: [id number] DOB: [birth year]  07/25/2019  Betty Bradley was observed post Covid-19 immunization for 15 minutes without incident. She was provided with Vaccine Information Sheet and instruction to access the V-Safe system.   Betty Bradley was instructed to call 911 with any severe reactions post vaccine: Marland Kitchen Difficulty breathing  . Swelling of face and throat  . A fast heartbeat  . A bad rash all over body  . Dizziness and weakness   Immunizations Administered    Name Date Dose VIS Date Route   Pfizer COVID-19 Vaccine 07/25/2019  8:56 AM 0.3 mL 04/03/2019 Intramuscular   Manufacturer: [employer], [employer]   Lot: [id number]   NDC: [address]-2

## 2019-08-18 ENCOUNTER — Ambulatory Visit: Payer: Self-pay | Attending: Internal Medicine

## 2019-08-18 DIAGNOSIS — Z23 Encounter for immunization: Secondary | ICD-10-CM

## 2019-08-18 NOTE — Progress Notes (Signed)
   Covid-19 Vaccination Clinic  Name:  Batul Diego    MRN: [id number] DOB: Nov 04, 1955  08/18/2019  Ms. Ciulla was observed post Covid-19 immunization for 15 minutes without incident. She was provided with Vaccine Information Sheet and instruction to access the V-Safe system.   Ms. Capshaw was instructed to call 911 with any severe reactions post vaccine: Marland Kitchen Difficulty breathing  . Swelling of face and throat  . A fast heartbeat  . A bad rash all over body  . Dizziness and weakness   Immunizations Administered    Name Date Dose VIS Date Route   Pfizer COVID-19 Vaccine 08/18/2019  4:07 PM 0.3 mL 06/17/2018 Intramuscular   Manufacturer: [employer], [employer]   Lot: [id number]   NDC: [address]-1

## 2020-09-10 ENCOUNTER — Emergency Department (HOSPITAL_COMMUNITY)
Admission: EM | Admit: 2020-09-10 | Discharge: 2020-09-10 | Disposition: A | Discharge status: Home / Self Care | Payer: 59 | Attending: Emergency Medicine | Admitting: Emergency Medicine

## 2020-09-10 ENCOUNTER — Other Ambulatory Visit: Payer: Self-pay

## 2020-09-10 ENCOUNTER — Encounter (HOSPITAL_COMMUNITY): Payer: Self-pay | Admitting: *Deleted

## 2020-09-10 ENCOUNTER — Emergency Department (HOSPITAL_COMMUNITY): Payer: 59

## 2020-09-10 DIAGNOSIS — R0789 Other chest pain: Secondary | ICD-10-CM | POA: Diagnosis not present

## 2020-09-10 DIAGNOSIS — Z20822 Contact with and (suspected) exposure to covid-19: Secondary | ICD-10-CM | POA: Insufficient documentation

## 2020-09-10 DIAGNOSIS — M546 Pain in thoracic spine: Secondary | ICD-10-CM | POA: Insufficient documentation

## 2020-09-10 LAB — HEPATIC FUNCTION PANEL
ALT: 14 U/L (ref 0–44)
AST: 21 U/L (ref 15–41)
Albumin: 4.1 g/dL (ref 3.5–5.0)
Alkaline Phosphatase: 80 U/L (ref 38–126)
Bilirubin, Direct: 0.2 mg/dL (ref 0.0–0.2)
Indirect Bilirubin: 1.3 mg/dL — ABNORMAL HIGH (ref 0.3–0.9)
Total Bilirubin: 1.5 mg/dL — ABNORMAL HIGH (ref 0.3–1.2)
Total Protein: 7.8 g/dL (ref 6.5–8.1)

## 2020-09-10 LAB — CBC
HCT: 41.9 % (ref 36.0–46.0)
Hemoglobin: 13.7 g/dL (ref 12.0–15.0)
MCH: 28.7 pg (ref 26.0–34.0)
MCHC: 32.7 g/dL (ref 30.0–36.0)
MCV: 87.8 fL (ref 80.0–100.0)
Platelets: 259 10*3/uL (ref 150–400)
RBC: 4.77 MIL/uL (ref 3.87–5.11)
RDW: 13 % (ref 11.5–15.5)
WBC: 8.3 10*3/uL (ref 4.0–10.5)
nRBC: 0 % (ref 0.0–0.2)

## 2020-09-10 LAB — BASIC METABOLIC PANEL
Anion gap: 8 (ref 5–15)
BUN: 9 mg/dL (ref 8–23)
CO2: 23 mmol/L (ref 22–32)
Calcium: 9.3 mg/dL (ref 8.9–10.3)
Chloride: 106 mmol/L (ref 98–111)
Creatinine, Ser: 0.54 mg/dL (ref 0.44–1.00)
GFR, Estimated: >60 mL/min (ref >60)
Glucose, Bld: 114 mg/dL — ABNORMAL HIGH (ref 70–99)
Potassium: 3.8 mmol/L (ref 3.5–5.1)
Sodium: 137 mmol/L (ref 135–145)

## 2020-09-10 LAB — TROPONIN I (HIGH SENSITIVITY)
Troponin I (High Sensitivity): 3 ng/L (ref <18)
Troponin I (High Sensitivity): <2 ng/L (ref <18)

## 2020-09-10 LAB — D-DIMER, QUANTITATIVE: D-Dimer, Quant: 0.44 ug/mL-FEU (ref 0.00–0.50)

## 2020-09-10 LAB — RESP PANEL BY RT-PCR (FLU A&B, COVID) ARPGX2
Influenza A by PCR: NEGATIVE
Influenza B by PCR: NEGATIVE
SARS Coronavirus 2 by RT PCR: NEGATIVE

## 2020-09-10 MED ORDER — KETOROLAC TROMETHAMINE 15 MG/ML IJ SOLN
15.0000 mg | Freq: Once | INTRAMUSCULAR | Status: AC
  Administered 2020-09-10: 15 mg via INTRAVENOUS
  Filled 2020-09-10: qty 1

## 2020-09-10 NOTE — ED Notes (Signed)
Pt in xray. Xray to bring pt to room when finished

## 2020-09-10 NOTE — ED Triage Notes (Signed)
Pt states R shoulder pain what increases with inspiration.  Pain does not increase with movement.  Denies nausea.

## 2020-09-10 NOTE — ED Notes (Signed)
Reviewed discharge instructions with patient and daughter. Follow-up care reviewed. Patient and daughter verbalized understanding. Patient A&Ox4, VSS, and ambulatory with steady gait upon discharge.  

## 2020-09-10 NOTE — ED Provider Notes (Signed)
[facility] EMERGENCY DEPARTMENT Provider Note   CSN: [id number] Arrival date & time: 09/10/20  1306     History Chief Complaint  Patient presents with  . Shoulder Pain    Betty Bradley is a 65 y.o. female.  A language interpreter was used (SpanishPecola Leisure [phone]).        Betty Bradley is a 65 y.o. female, patient with no known past medical history, presenting to the ED with chest pain beginning last night. Right upper chest pain, difficult to describe, mild at rest, also present in the right upper back, 7/10 with deep breathing. She has not had this pain before.  She has no shortness of breath at rest.  Denies fever/chills, cough, N/V/D, abdominal pain, dizziness, syncope, lower extremity edema/pain, or any other complaints.  History reviewed. No pertinent past medical history.  There are no problems to display for this patient.   Past Surgical History:  Procedure Laterality Date  . ABDOMINAL HYSTERECTOMY    . CESAREAN SECTION     3 previous  . CHOLECYSTECTOMY       OB History    Gravida  4   Para  3   Term  3   Preterm      AB  1   Living  4     SAB  1   IAB      Ectopic      Multiple  1   Live Births  4           No family history on file.  Social History   Tobacco Use  . Smoking status: Never Smoker  . Smokeless tobacco: Never Used  Substance Use Topics  . Alcohol use: No  . Drug use: No    Home Medications Prior to Admission medications   Medication Sig Start Date End Date Taking? Authorizing Provider  hydrOXYzine (ATARAX/VISTARIL) 25 MG tablet Take 0.5-1 tablets (12.5-25 mg total) by mouth every 8 (eight) hours as needed for anxiety. Patient not taking: Reported on 10/14/2015 10/12/13   Emi Belfast, FNP  traZODone (DESYREL) 100 MG tablet Take 1 tablet (100 mg total) by mouth at bedtime. Patient not taking: Reported on 10/14/2015 09/08/13   Elvina Sidle, MD    Allergies    Patient has no known  allergies.  Review of Systems   Review of Systems  Constitutional: Negative for chills, diaphoresis and fever.  Respiratory: Negative for cough and shortness of breath.   Cardiovascular: Positive for chest pain.  Gastrointestinal: Negative for abdominal pain, diarrhea, nausea and vomiting.  Genitourinary: Negative for dysuria.  Musculoskeletal: Positive for back pain. Negative for neck pain.  Neurological: Negative for dizziness, syncope, weakness, numbness and headaches.  All other systems reviewed and are negative.   Physical Exam Updated Vital Signs BP 106/76 (BP Location: Right Arm)   Pulse 67   Temp 99.5 F (37.5 C)   Resp 18   Ht 5\' 2"  (1.575 m)   Wt 65.5 kg   SpO2 98%   BMI 26.41 kg/m   Physical Exam Vitals and nursing note reviewed.  Constitutional:      General: She is not in acute distress.    Appearance: She is well-developed. She is not diaphoretic.  HENT:     Head: Normocephalic and atraumatic.     Mouth/Throat:     Mouth: Mucous membranes are moist.     Pharynx: Oropharynx is clear.  Eyes:     Conjunctiva/sclera: Conjunctivae normal.  Cardiovascular:  Rate and Rhythm: Normal rate and regular rhythm.     Pulses: Normal pulses.          Radial pulses are 2+ on the right side and 2+ on the left side.       Posterior tibial pulses are 2+ on the right side and 2+ on the left side.     Heart sounds: Normal heart sounds.     Comments: Tactile temperature in the extremities appropriate and equal bilaterally. Pulmonary:     Effort: Pulmonary effort is normal. No respiratory distress.     Breath sounds: Normal breath sounds.  Abdominal:     Palpations: Abdomen is soft.     Tenderness: There is no abdominal tenderness. There is no guarding.  Musculoskeletal:     Cervical back: Neck supple.     Right lower leg: No edema.     Left lower leg: No edema.     Comments: No pain in his chest or back with range of motion of the right shoulder with range of motion  of the neck. No tenderness in the right upper back or chest.  Lymphadenopathy:     Cervical: No cervical adenopathy.  Skin:    General: Skin is warm and dry.     Findings: Rash:    Neurological:     Mental Status: She is alert.     Comments: No noted acute cognitive deficit. Sensation grossly intact to light touch in the extremities.   Grip strengths equal bilaterally.   Strength 5/5 in all extremities.  No gait disturbance.  Coordination intact.  Cranial nerves III-XII grossly intact.  Handles oral secretions without noted difficulty.  No noted phonation or speech deficit. No facial droop.   Psychiatric:        Mood and Affect: Mood and affect normal.        Speech: Speech normal.        Behavior: Behavior normal.     ED Results / Procedures / Treatments   Labs (all labs ordered are listed, but only abnormal results are displayed) Labs Reviewed  BASIC METABOLIC PANEL - Abnormal; Notable for the following components:      Result Value   Glucose, Bld 114 (*)    All other components within normal limits  CBC  D-DIMER, QUANTITATIVE  HEPATIC FUNCTION PANEL  TROPONIN I (HIGH SENSITIVITY)  TROPONIN I (HIGH SENSITIVITY)    EKG EKG Interpretation  Date/Time:  Saturday Sep 10 2020 13:16:32 EDT Ventricular Rate:  86 PR Interval:  140 QRS Duration: 72 QT Interval:  370 QTC Calculation: 442 R Axis:   4 Text Interpretation: Normal sinus rhythm Low voltage QRS Nonspecific T wave abnormality Abnormal ECG Confirmed by Alvester Chou [phone]) on 09/10/2020 2:05:29 PM   Radiology DG Chest 2 View  Result Date: 09/10/2020 CLINICAL DATA:  Left shoulder pain.  Shortness of breath. EXAM: CHEST - 2 VIEW COMPARISON:  None. FINDINGS: The heart size is borderline to mildly enlarged. The hila and mediastinum are normal. No pneumothorax. Platelike opacity in the medial right lung base consistent with atelectasis. No overt edema. No suspicious infiltrates. IMPRESSION: No active  cardiopulmonary disease. Electronically Signed   By: Gerome Sam III M.D   On: 09/10/2020 14:09    Procedures Procedures   Medications Ordered in ED Medications - No data to display  ED Course  I have reviewed the triage vital signs and the nursing notes.  Pertinent labs & imaging results that were available during my care of  the patient were reviewed by me and considered in my medical decision making (see chart for details).    MDM Rules/Calculators/A&P                          Patient presents with right upper chest and back pain, sounds pleuritic in nature. Patient is nontoxic appearing, afebrile, not tachycardic, not tachypneic, not hypotensive, maintains excellent SPO2 on room air, and is in no apparent distress.   I have reviewed the patient's chart to obtain more information.   Wells score is 0. We can not use PERC due to age.   End of shift patient care handoff report given to EM resident, Jacklynn Bue.  Plan: Awaiting second troponin and D-dimer results.  Likely discharge.  Vitals:   09/10/20 1315 09/10/20 1444 09/10/20 1518  BP: 117/80 106/76   Pulse: 87 67   Resp: 20 18   Temp: 99.5 F (37.5 C)    SpO2: 97% 98%   Weight:   65.5 kg  Height:   5\' 2"  (1.575 m)        Final Clinical Impression(s) / ED Diagnoses Final diagnoses:  None    Rx / DC Orders ED Discharge Orders    None       09/10/20 1534    09/12/20, MD 09/10/20 1710

## 2020-09-10 NOTE — ED Notes (Signed)
Got patient on the monitor patient is resting with call bell in reach  ?

## 2020-09-10 NOTE — Discharge Instructions (Addendum)
You can go to [email or web address] or use the [email or web address] app to follow-up on your covid test results.  Come back for chest pain, worsening trouble breathing, or any other emergencies.   Follow-up with a doctor for continued symptoms.

## 2020-09-10 NOTE — ED Provider Notes (Signed)
  Physical Exam  BP 103/69   Pulse 62   Temp 99.5 F (37.5 C)   Resp 18   Ht 5\' 2"  (1.575 m)   Wt 65.5 kg   SpO2 96%   BMI 26.41 kg/m   Physical Exam Vitals and nursing note reviewed.  Constitutional:      General: She is not in acute distress.    Appearance: She is well-developed.  HENT:     Head: Normocephalic and atraumatic.  Eyes:     Conjunctiva/sclera: Conjunctivae normal.  Cardiovascular:     Rate and Rhythm: Normal rate and regular rhythm.     Heart sounds: No murmur heard.   Pulmonary:     Effort: Pulmonary effort is normal. No respiratory distress.     Breath sounds: Normal breath sounds.  Abdominal:     Palpations: Abdomen is soft.     Tenderness: There is no abdominal tenderness.  Musculoskeletal:     Cervical back: Neck supple.     Right lower leg: No edema.     Left lower leg: No edema.     Comments: R mid thoracic back, location of pain, is nontender  Skin:    General: Skin is warm and dry.     Comments: No rash over site of pain  Neurological:     General: No focal deficit present.     Mental Status: She is alert.     Cranial Nerves: No cranial nerve deficit.  Psychiatric:        Behavior: Behavior normal.        Judgment: Judgment normal.     ED Course/Procedures     Procedures  MDM  Pt presents with pleuritic chest pain, worse with inspiration, nontender to palpation, low risk factor history.   ECG shows TWI in anterior leads, Tn negative. Repeat pending. Otherwise sinus no tachycardia CXR 2-v without focal infectious finding, interstitial edema, or mediastinal widening Labs pending: d-dimer for PE eval, repeat Tn. Will follow-up.   Laboratory studies are resulted and are unremarkable.  Examination as documented above.  She says the pain started last night.  It has resolved in the top of her shoulder and is just present on the right side of her back and not tender to palpation just with inspiration.  She has been feeling "bad" for about  a week but denies cough, congestion, sore throat, or other focal symptoms on review.  COVID test ordered to further evaluate but patient stable and appropriate for discharge at this time with stable vital signs and reassuring exam.  Will give a dose of Toradol for her symptoms.  Discussed the case with her and her daughter and advised outpatient follow-up and return precautions.   , MD 09/10/20 09/12/20    Jovita Gamma, MD 09/13/20 1327

## 2022-03-15 IMAGING — DX DG CHEST 2V
2 series · 2 of 2 positions shown · non-contrast
Comparison: None.

CLINICAL DATA: Left shoulder pain.  Shortness of breath.

EXAM:
CHEST - 2 VIEW

[chest pa]
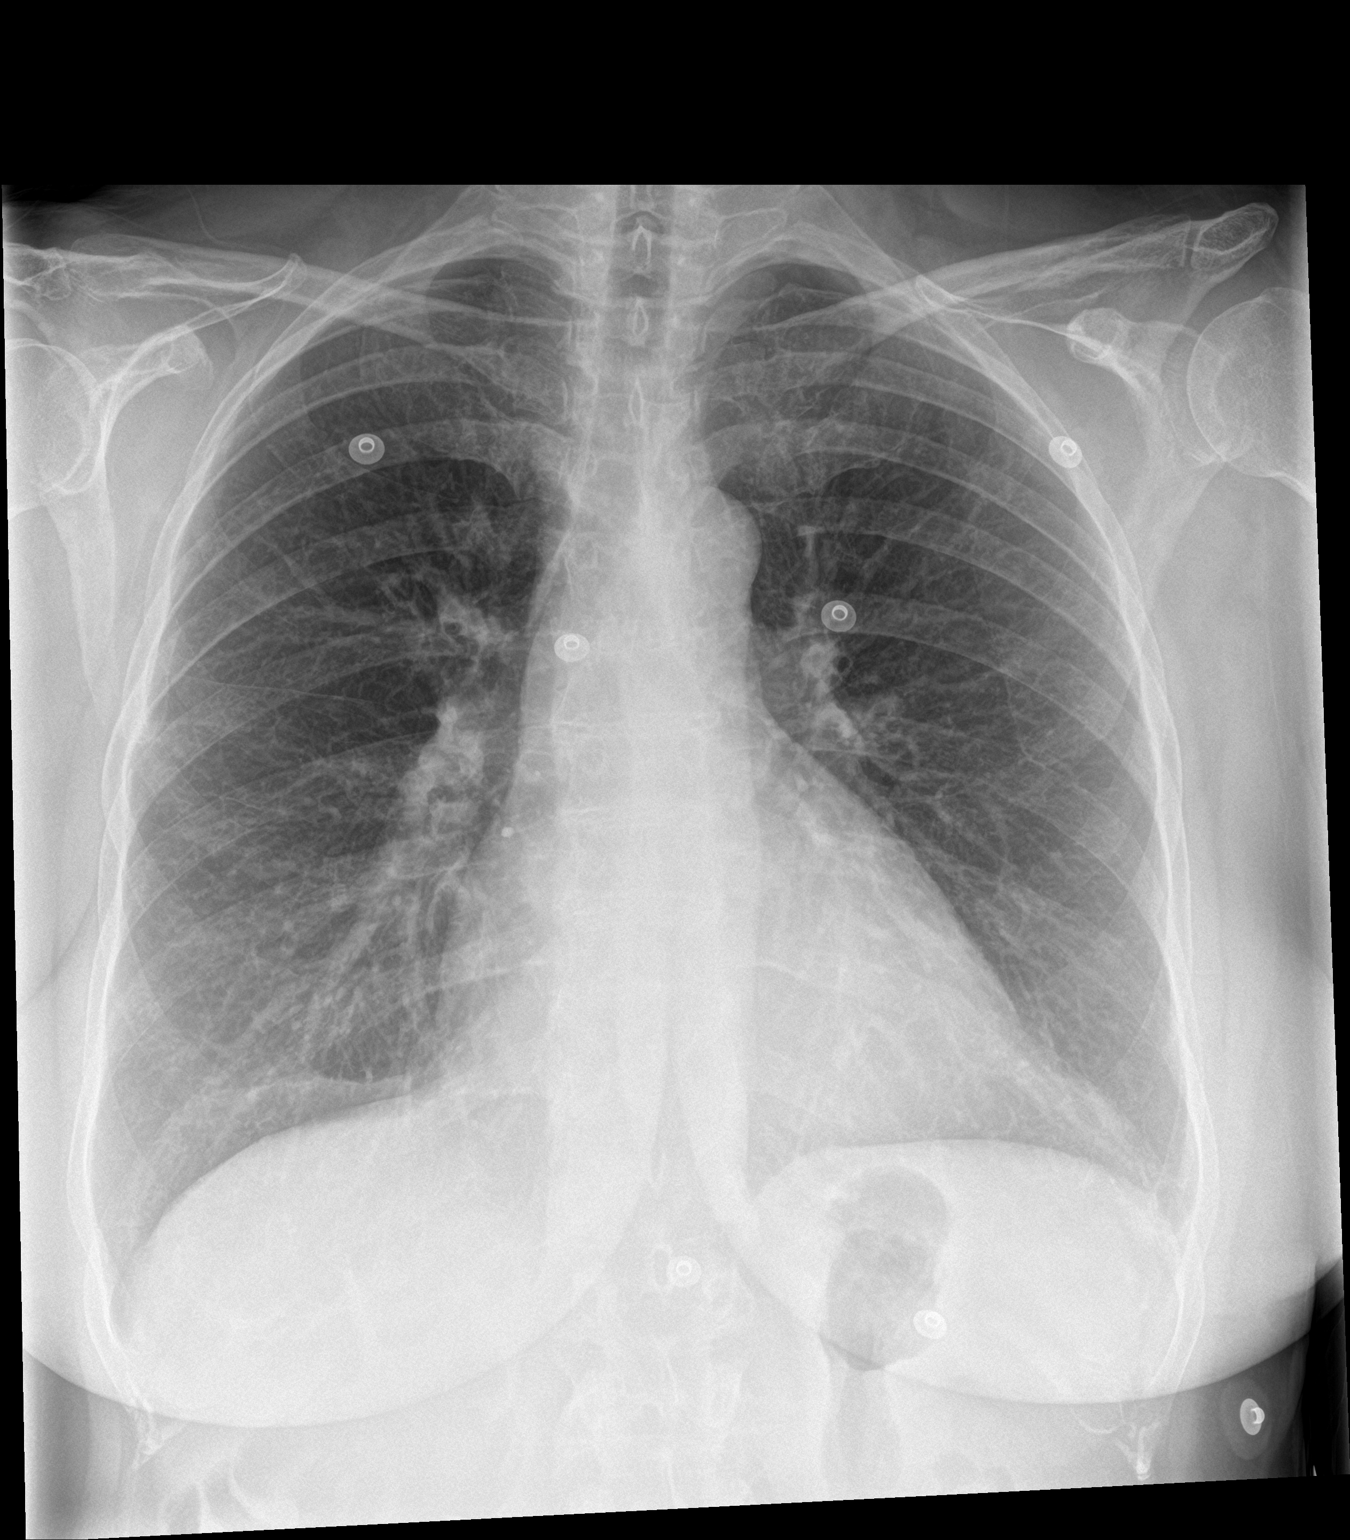

[chest lat]
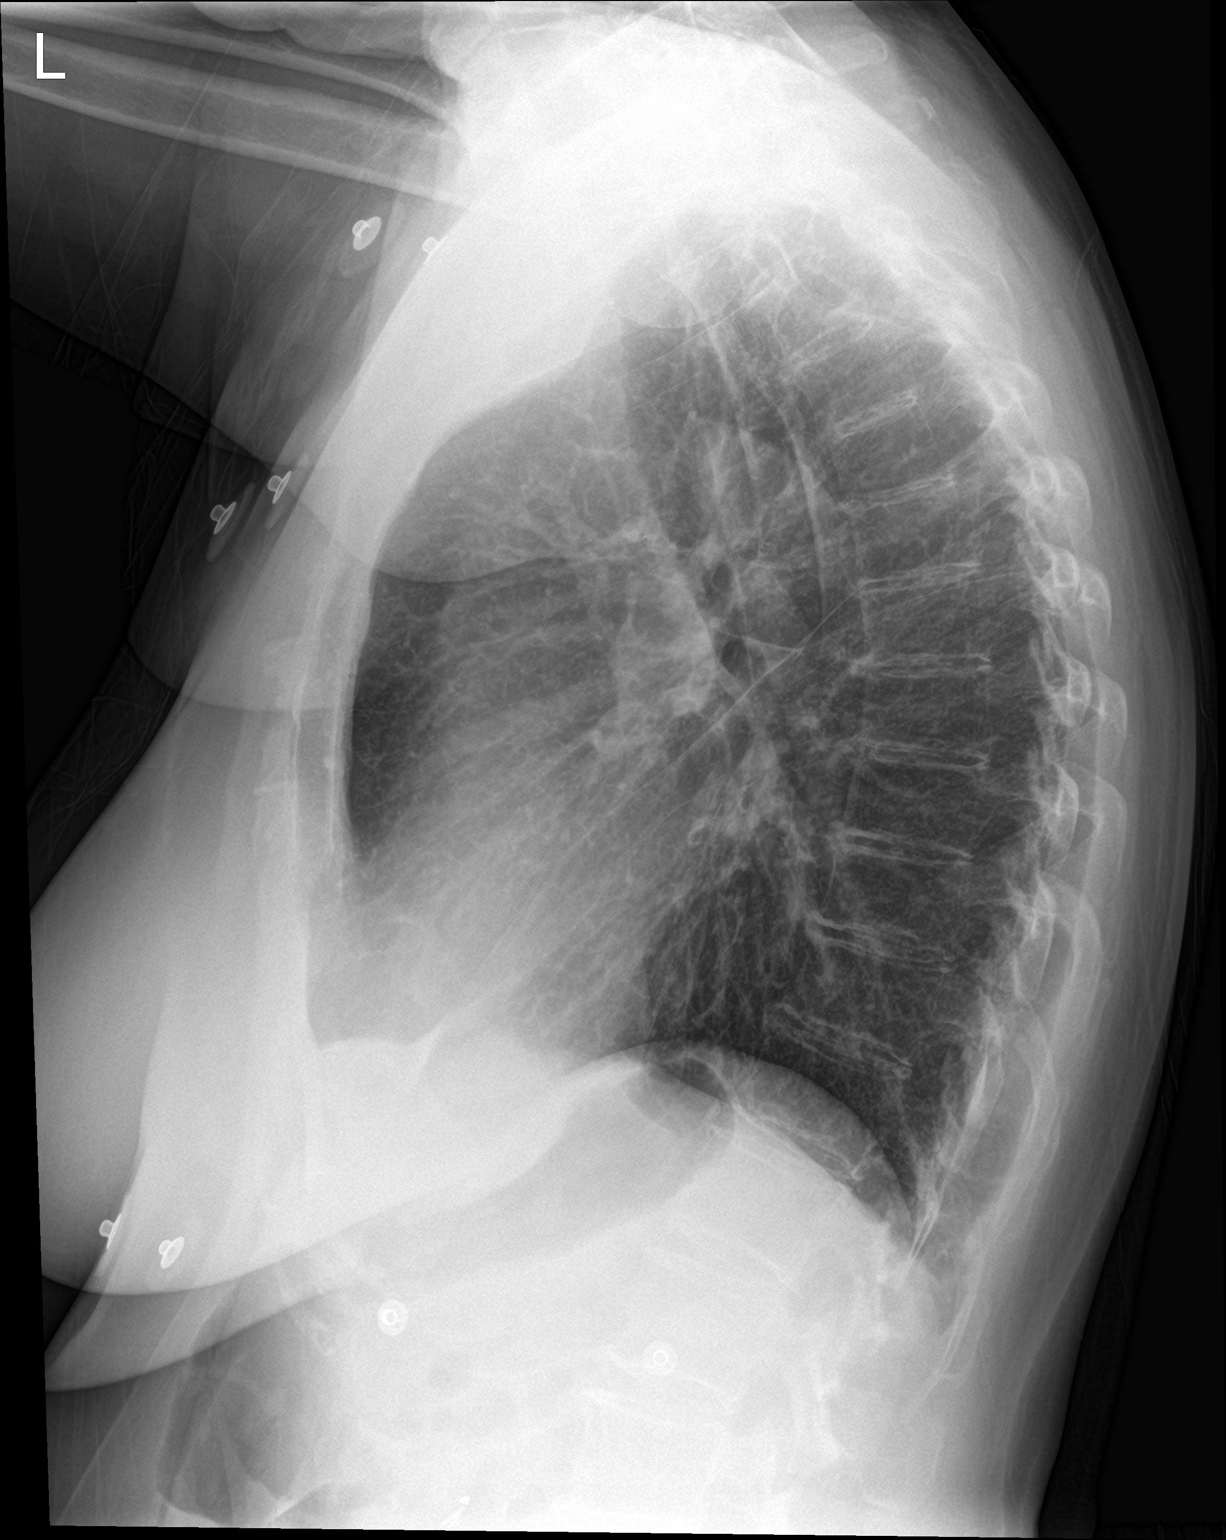

[2 of 2 positions shown; findings below may reference images not displayed]

FINDINGS: The heart size is borderline to mildly enlarged. The hila and
mediastinum are normal. No pneumothorax. Platelike opacity in the
medial right lung base consistent with atelectasis. No overt edema.
No suspicious infiltrates.
IMPRESSION: No active cardiopulmonary disease.

## 2023-03-07 ENCOUNTER — Other Ambulatory Visit: Payer: Self-pay | Admitting: Family Medicine

## 2023-03-07 DIAGNOSIS — Z13820 Encounter for screening for osteoporosis: Secondary | ICD-10-CM

## 2023-04-06 ENCOUNTER — Emergency Department (HOSPITAL_COMMUNITY)
Admission: EM | Admit: 2023-04-06 | Discharge: 2023-04-06 | Disposition: A | Discharge status: Home / Self Care | Payer: Medicare Other | Attending: Emergency Medicine | Admitting: Emergency Medicine

## 2023-04-06 ENCOUNTER — Other Ambulatory Visit: Payer: Self-pay

## 2023-04-06 ENCOUNTER — Encounter (HOSPITAL_COMMUNITY): Payer: Self-pay

## 2023-04-06 ENCOUNTER — Emergency Department (HOSPITAL_COMMUNITY): Payer: Medicare Other

## 2023-04-06 DIAGNOSIS — R0789 Other chest pain: Secondary | ICD-10-CM | POA: Diagnosis present

## 2023-04-06 LAB — BASIC METABOLIC PANEL
Anion gap: 9 (ref 5–15)
BUN: 15 mg/dL (ref 8–23)
CO2: 26 mmol/L (ref 22–32)
Calcium: 9.5 mg/dL (ref 8.9–10.3)
Chloride: 105 mmol/L (ref 98–111)
Creatinine, Ser: 0.73 mg/dL (ref 0.44–1.00)
GFR, Estimated: >60 mL/min (ref >60)
Glucose, Bld: 140 mg/dL — ABNORMAL HIGH (ref 70–99)
Potassium: 3.8 mmol/L (ref 3.5–5.1)
Sodium: 140 mmol/L (ref 135–145)

## 2023-04-06 LAB — CBC
HCT: 43.7 % (ref 36.0–46.0)
Hemoglobin: 14.6 g/dL (ref 12.0–15.0)
MCH: 28.9 pg (ref 26.0–34.0)
MCHC: 33.4 g/dL (ref 30.0–36.0)
MCV: 86.4 fL (ref 80.0–100.0)
Platelets: 269 10*3/uL (ref 150–400)
RBC: 5.06 MIL/uL (ref 3.87–5.11)
RDW: 13.1 % (ref 11.5–15.5)
WBC: 8.2 10*3/uL (ref 4.0–10.5)
nRBC: 0 % (ref 0.0–0.2)

## 2023-04-06 LAB — TROPONIN I (HIGH SENSITIVITY)
Troponin I (High Sensitivity): 3 ng/L (ref <18)
Troponin I (High Sensitivity): 4 ng/L (ref <18)

## 2023-04-06 LAB — D-DIMER, QUANTITATIVE: D-Dimer, Quant: 0.39 ug{FEU}/mL (ref 0.00–0.50)

## 2023-04-06 MED ORDER — KETOROLAC TROMETHAMINE 60 MG/2ML IM SOLN: 30.0000 mg | Freq: Once | INTRAMUSCULAR | Status: DC

## 2023-04-06 MED ORDER — KETOROLAC TROMETHAMINE 15 MG/ML IJ SOLN: 15.0000 mg | Freq: Once | INTRAMUSCULAR | Status: DC

## 2023-04-06 MED ORDER — LIDOCAINE 5 % EX PTCH
1.0000 | MEDICATED_PATCH | CUTANEOUS | Status: DC
  Administered 2023-04-06: 1 via TRANSDERMAL
  Filled 2023-04-06: qty 1

## 2023-04-06 MED ORDER — KETOROLAC TROMETHAMINE 15 MG/ML IJ SOLN
15.0000 mg | Freq: Once | INTRAMUSCULAR | Status: AC
  Administered 2023-04-06: 15 mg via INTRAVENOUS
  Filled 2023-04-06: qty 1

## 2023-04-06 NOTE — ED Triage Notes (Signed)
Patient reports sudden left sided chest pain radiating in to left shoulder and down left arm associated with SOB that started last night and has continued into today.  Denies n/v dizziness sweating Denies long travel times recently.

## 2023-04-06 NOTE — ED Provider Notes (Signed)
[place] EMERGENCY DEPARTMENT AT [facility] Provider Note   CSN: [id number] Arrival date & time: 04/06/23  1128     History  Chief Complaint  Patient presents with   Chest Pain    Betty Bradley is a 67 y.o. female.   Chest Pain    67 year old female with medical history noncontributory who presents to the emergency department with left-sided chest pain.  The patient states that symptoms started last night and has continued into today.  She endorses left-sided chest pain radiating down her left arm with associated mild shortness of breath.  She denies any nausea, vomiting, diaphoresis, lightheadedness.  No recent long travel.  No history of DVT or PE.  She denies any abdominal pain.  No fevers, chills or cough.  She has not had a heart attack, states that she had a stress test in [place] at 1 point that was abnormal but nothing was done further regarding this.  Home Medications Prior to Admission medications   Medication Sig Start Date End Date Taking? Authorizing Provider  hydrOXYzine (ATARAX/VISTARIL) 25 MG tablet Take 0.5-1 tablets (12.5-25 mg total) by mouth every 8 (eight) hours as needed for anxiety. Patient not taking: Reported on 10/14/2015 10/12/13   Emi Belfast, FNP  traZODone (DESYREL) 100 MG tablet Take 1 tablet (100 mg total) by mouth at bedtime. Patient not taking: Reported on 10/14/2015 09/08/13   Elvina Sidle, MD      Allergies    Patient has no known allergies.    Review of Systems   Review of Systems  Cardiovascular:  Positive for chest pain.  All other systems reviewed and are negative.   Physical Exam Updated Vital Signs BP 101/65   Pulse 64   Temp 98.1 F (36.7 C) (Oral)   Resp 17   Ht 5\' 2"  (1.575 m)   Wt 65.3 kg   SpO2 97%   BMI 26.34 kg/m  Physical Exam Vitals and nursing note reviewed.  Constitutional:      General: She is not in acute distress.    Appearance: She is well-developed.  HENT:     Head: Normocephalic  and atraumatic.  Eyes:     Conjunctiva/sclera: Conjunctivae normal.  Cardiovascular:     Rate and Rhythm: Normal rate and regular rhythm.     Heart sounds: No murmur heard. Pulmonary:     Effort: Pulmonary effort is normal. No respiratory distress.     Breath sounds: Normal breath sounds.  Chest:     Comments: Left chest wall with tenderness to palpation that reproduces the patient's pain Abdominal:     Palpations: Abdomen is soft.     Tenderness: There is no abdominal tenderness.  Musculoskeletal:        General: No swelling.     Cervical back: Neck supple.  Skin:    General: Skin is warm and dry.     Capillary Refill: Capillary refill takes less than 2 seconds.  Neurological:     Mental Status: She is alert.     GCS: GCS eye subscore is 4. GCS verbal subscore is 5. GCS motor subscore is 6.     Cranial Nerves: Cranial nerves 2-12 are intact.     Sensory: Sensation is intact.     Motor: Motor function is intact.  Psychiatric:        Mood and Affect: Mood normal.     ED Results / Procedures / Treatments   Labs (all labs ordered are listed, but only abnormal  results are displayed) Labs Reviewed  BASIC METABOLIC PANEL - Abnormal; Notable for the following components:      Result Value   Glucose, Bld 140 (*)    All other components within normal limits  CBC  D-DIMER, QUANTITATIVE  TROPONIN I (HIGH SENSITIVITY)  TROPONIN I (HIGH SENSITIVITY)    EKG EKG Interpretation Date/Time:  Saturday April 06 2023 11:33:23 EST Ventricular Rate:  89 PR Interval:  138 QRS Duration:  72 QT Interval:  370 QTC Calculation: 450 R Axis:   -11  Text Interpretation: Normal sinus rhythm Confirmed by Ernie Avena ([id number]) on 04/06/2023 1:13:05 PM  Radiology DG Chest 2 View Result Date: 04/06/2023 CLINICAL DATA:  Shortness of breath.  Chest pain. EXAM: CHEST - 2 VIEW COMPARISON:  09/10/2020 FINDINGS: The heart size and mediastinal contours are within normal limits. Both lungs are  clear. The visualized skeletal structures are unremarkable. IMPRESSION: No active cardiopulmonary disease. Electronically Signed   By: Danae Orleans M.D.   On: 04/06/2023 13:28    Procedures Procedures    Medications Ordered in ED Medications  lidocaine (LIDODERM) 5 % 1 patch (1 patch Transdermal Patch Applied 04/06/23 1536)  ketorolac (TORADOL) 15 MG/ML injection 15 mg (15 mg Intravenous Given 04/06/23 1536)    ED Course/ Medical Decision Making/ A&P Clinical Course as of 04/06/23 1638  Sat Apr 06, 2023  1541 D-Dimer, Quant: 0.39 [JL]  1541 Troponin I (High Sensitivity): 3 [JL]    Clinical Course User Index [JL] Ernie Avena, MD             HEART Score: 2                    Medical Decision Making Amount and/or Complexity of Data Reviewed Labs: ordered. Decision-making details documented in ED Course. Radiology: ordered.  Risk Prescription drug management.    67 year old female with medical history noncontributory who presents to the emergency department with left-sided chest pain.  The patient states that symptoms started last night and has continued into today.  She endorses left-sided chest pain radiating down her left arm with associated mild shortness of breath.  She denies any nausea, vomiting, diaphoresis, lightheadedness.  No recent long travel.  No history of DVT or PE.  She denies any abdominal pain.  No fevers, chills or cough.  She has not had a heart attack, states that she had a stress test in [place] at 1 point that was abnormal but nothing was done further regarding this.    Medical Decision Making: Betty Bradley is a 67 y.o. female who presented to the ED today with chest pain, detailed above.  Based on patient's comorbidities, patient has a heart score of 2.    Patient placed on continuous vitals and telemetry monitoring while in ED which was reviewed periodically.  Complete initial physical exam performed, notably the patient was lungs CTAB, reproducible  left-sided chest wall tenderness to palpation, neurologically intact.   Reviewed and confirmed nursing documentation for past medical history, family history, social history.    Initial Assessment: With the patient's presentation of left-sided chest pain, most likely diagnosis is musculoskeletal chest pain versus GERD, although ACS remains on the differential. Other diagnoses were considered including (but not limited to) pulmonary embolism, community-acquired pneumonia, aortic dissection, pneumothorax, underlying bony abnormality, anemia. These are considered less likely due to history of present illness and physical exam findings.    In particular, concerning pulmonary embolism: Patient is PERC positive and the they deny malignancy,  recent surgery, history of DVT, or calf tenderness leading to a low risk Wells score. Aortic Dissection also considered but seems less likely based on the location, quality, onset, and severity of symptoms in this case.   Initial Plan: Evaluate for ACS with delta troponin and EKG evaluated as below  Evaluate for dissection, bony abnormality, or pneumonia with chest x-ray and screening laboratory evaluation including CBC, BMP  Further evaluation for pulmonary embolism  indicated at this time based on patient's PERC and Wells score.  Further evaluation for Thoracic Aortic Dissection not indicated at this time based on patient's clinical history and PE findings.   Initial Study Results: EKG was reviewed independently. Rate, rhythm, axis, intervals all examined and without medically relevant abnormality. ST segments without concerns for elevations.    Laboratory  Delta  troponin demonstrated normal values  D-dimer negative  CBC and BMP without obvious metabolic or inflammatory abnormalities requiring further evaluation   Radiology  DG Chest 2 View Result Date: 04/06/2023 CLINICAL DATA:  Shortness of breath.  Chest pain. EXAM: CHEST - 2 VIEW COMPARISON:   09/10/2020 FINDINGS: The heart size and mediastinal contours are within normal limits. Both lungs are clear. The visualized skeletal structures are unremarkable. IMPRESSION: No active cardiopulmonary disease. Electronically Signed   By: Danae Orleans M.D.   On: 04/06/2023 13:28    Final Assessment and Plan: Patient with a heart score of 2, normal delta troponins, D-dimer negative, remainder of labs, imaging and EKG overall reassuring.  Patient with reproducible chest wall tenderness to palpation on exam, resolved and improved with Toradol and a lidocaine patch.  Suspect likely musculoskeletal chest pain, however given the patient's report of abnormal stress test previously, will provide referral for outpatient follow-up with cardiology for consideration for outpatient stress testing.  Patient overall stable for discharge, return precautions provided.    Final Clinical Impression(s) / ED Diagnoses Final diagnoses:  Chest wall pain  Atypical chest pain    Rx / DC Orders ED Discharge Orders          Ordered    Ambulatory referral to Cardiology       Comments: If you have not heard from the Cardiology office within the next 72 hours please call [phone].   04/06/23 9147              Ernie Avena, MD 04/06/23 [phone]

## 2023-04-06 NOTE — Discharge Instructions (Addendum)
Your cardiac enzymes were normal, your chest x-ray and EKG was also unremarkable.  You have reproducible chest wall tenderness to palpation which points more towards a musculoskeletal chest pain.  Your symptoms improved with Toradol which is a nonsteroidal anti-inflammatory.  Recommend Motrin for pain control over the next few days.  Follow-up with your primary care provider, can schedule follow-up with your PCP or follow-up with cardiology for consideration for outpatient cardiac stress testing as this can risk straify you for an adverse cardiac event.

## 2023-05-22 DIAGNOSIS — H35363 Drusen (degenerative) of macula, bilateral: Secondary | ICD-10-CM | POA: Diagnosis not present

## 2023-05-22 DIAGNOSIS — H5203 Hypermetropia, bilateral: Secondary | ICD-10-CM | POA: Diagnosis not present

## 2023-05-30 ENCOUNTER — Encounter: Payer: Self-pay | Admitting: Cardiology

## 2023-05-30 ENCOUNTER — Ambulatory Visit: Payer: Medicare Other | Attending: Cardiology | Admitting: Cardiology

## 2023-05-30 VITALS — BP 120/76 | HR 75 | Resp 16 | Ht 62.0 in | Wt 150.4 lb

## 2023-05-30 DIAGNOSIS — R072 Precordial pain: Secondary | ICD-10-CM

## 2023-05-30 DIAGNOSIS — E782 Mixed hyperlipidemia: Secondary | ICD-10-CM

## 2023-05-30 DIAGNOSIS — E781 Pure hyperglyceridemia: Secondary | ICD-10-CM | POA: Diagnosis not present

## 2023-05-30 MED ORDER — NITROGLYCERIN 0.4 MG SL SUBL: 0.4000 mg | SUBLINGUAL_TABLET | SUBLINGUAL | 0 refills | Status: AC | PRN

## 2023-05-30 MED ORDER — ASPIRIN 81 MG PO TBEC: 81.0000 mg | DELAYED_RELEASE_TABLET | Freq: Every day | ORAL | Status: AC

## 2023-05-30 MED ORDER — METOPROLOL TARTRATE 25 MG PO TABS: 25.0000 mg | ORAL_TABLET | Freq: Two times a day (BID) | ORAL | 0 refills | Status: AC

## 2023-05-30 NOTE — Progress Notes (Signed)
 Cardiology Office Note:    Date:  05/30/2023  NAME:  Betty Bradley    MRN: [id number] DOB:  02/19/1956   PCP:  Chet Mad, DO  Former Cardiology Providers: none Primary Cardiologist:  Madonna Large, DO, [facility] (established care 05/30/2023) Electrophysiologist:  None   Referring MD: Jerrol Agent, MD  Reason of Consult: Chest Pain.  Chief Complaint  Patient presents with   Chest Pain   New Patient (Initial Visit)    History of Present Illness:    Betty Bradley is a 68 y.o. Hispanic female whose past medical history and cardiovascular risk factors includes: prediabetes, HLD, hypertriglyceridemia. She is being seen today for the evaluation of chest pain at the request of Jerrol Agent, MD.  Patient is accompanied by her daughter at today's office visit as well as a medical interpreter who provides interpretation between English and Spanish.  Chest pain: In November 2024 she went for a routine physical and was noted to have an abnormal EKG but no workup was performed as she was asymptomatic.  In December 2024 she went to the ED for chest pain.  Left-sided, intermittent, currently unable to describe the sensation, intensity is 4 out of 10, not brought on by effort related activities, did not resolve with rest.  She was referred to cardiology for further evaluation and management.  Since her episode in December 2024 she has intermittent episodes of poke like sensation over the left anterior precordium.  Patient states that she is avoiding physical activity due to concerns for anginal chest pain or passing out.  Current Medications: Current Meds  Medication Sig   aspirin  EC 81 MG tablet Take 1 tablet (81 mg total) by mouth daily. Swallow whole.   metoprolol  tartrate (LOPRESSOR ) 25 MG tablet Take 1 tablet (25 mg total) by mouth 2 (two) times daily. START two days before CTA. STOP when scan is complete.Take 90-120 minutes prior to scan. Hold for SBP less than 100 and/or heart rate  less than 55.   nitroGLYCERIN  (NITROSTAT ) 0.4 MG SL tablet Place 1 tablet (0.4 mg total) under the tongue every 5 (five) minutes as needed for chest pain. You can only take a max of three tablets per chest pain episode   rosuvastatin (CRESTOR) 10 MG tablet Take 10 mg by mouth daily.     Allergies:    Patient has no known allergies.   Past Medical History: Past Medical History:  Diagnosis Date   Hypertriglyceridemia    Mixed hyperlipidemia     Past Surgical History: Past Surgical History:  Procedure Laterality Date   ABDOMINAL HYSTERECTOMY     CESAREAN SECTION     3 previous   CHOLECYSTECTOMY      Social History: Social History   Tobacco Use   Smoking status: Never   Smokeless tobacco: Never  Vaping Use   Vaping status: Never Used  Substance Use Topics   Alcohol use: No   Drug use: No    Family History: History reviewed. No pertinent family history.  ROS:   Review of Systems  Cardiovascular:  Positive for chest pain (see HPI). Negative for claudication, irregular heartbeat, leg swelling, near-syncope, orthopnea, palpitations, paroxysmal nocturnal dyspnea and syncope.  Respiratory:  Negative for shortness of breath.   Hematologic/Lymphatic: Negative for bleeding problem.    EKGs/Labs/Other Studies Reviewed:   EKG: EKG Interpretation Date/Time:  Thursday May 30 2023 09:19:48 EST Ventricular Rate:  68 PR Interval:  142 QRS Duration:  72 QT Interval:  420 QTC Calculation: 446  R Axis:   -12  Text Interpretation: Normal sinus rhythm Low voltage QRS Cannot rule out Anterior infarct (cited on or before 30-May-2023) When compared with ECG of 06-Apr-2023 11:33, Nonspecific T wave abnormality now evident in Inferior leads Nonspecific T wave abnormality, worse in Anterior leads Confirmed by Michele Richardson [phone]) on 05/30/2023 9:24:30 AM  Echocardiogram: none  Stress Testing:  None  Coronary CTA: None   Labs:    Latest Ref Rng & Units 04/06/2023   11:40 AM  09/10/2020    1:29 PM 10/12/2013    3:32 PM  CBC  WBC 4.0 - 10.5 K/uL 8.2  8.3  6.7   Hemoglobin 12.0 - 15.0 g/dL 85.3  86.2  86.2   Hematocrit 36.0 - 46.0 % 43.7  41.9  42.2   Platelets 150 - 400 K/uL 269  259         Latest Ref Rng & Units 04/06/2023   11:40 AM 09/10/2020    1:29 PM 10/12/2013    3:26 PM  BMP  Glucose 70 - 99 mg/dL 859  885  892   BUN 8 - 23 mg/dL 15  9  10    Creatinine 0.44 - 1.00 mg/dL 9.26  9.45  9.40   Sodium 135 - 145 mmol/L 140  137  137   Potassium 3.5 - 5.1 mmol/L 3.8  3.8  4.1   Chloride 98 - 111 mmol/L 105  106  103   CO2 22 - 32 mmol/L 26  23  26    Calcium 8.9 - 10.3 mg/dL 9.5  9.3  9.9       Latest Ref Rng & Units 04/06/2023   11:40 AM 09/10/2020    3:17 PM 09/10/2020    1:29 PM  CMP  Glucose 70 - 99 mg/dL 859   885   BUN 8 - 23 mg/dL 15   9   Creatinine 9.55 - 1.00 mg/dL 9.26   9.45   Sodium 864 - 145 mmol/L 140   137   Potassium 3.5 - 5.1 mmol/L 3.8   3.8   Chloride 98 - 111 mmol/L 105   106   CO2 22 - 32 mmol/L 26   23   Calcium 8.9 - 10.3 mg/dL 9.5   9.3   Total Protein 6.5 - 8.1 g/dL  7.8    Total Bilirubin 0.3 - 1.2 mg/dL  1.5    Alkaline Phos 38 - 126 U/L  80    AST 15 - 41 U/L  21    ALT 0 - 44 U/L  14      Lab Results  Component Value Date   CHOL 203 (H) 10/21/2015   HDL 51 10/21/2015   LDLCALC 125 (H) 10/21/2015   TRIG 135 10/21/2015   CHOLHDL 4.0 10/21/2015   No results for input(s): LIPOA in the last 8760 hours. No components found for: NTPROBNP No results for input(s): PROBNP in the last 8760 hours. No results for input(s): TSH in the last 8760 hours.  Physical Exam:    Today's Vitals   05/30/23 0916  BP: 120/76  Pulse: 75  Resp: 16  SpO2: 99%  Weight: 150 lb 6.4 oz (68.2 kg)  Height: 5' 2 (1.575 m)   Body mass index is 27.51 kg/m. Wt Readings from Last 3 Encounters:  05/30/23 150 lb 6.4 oz (68.2 kg)  04/06/23 144 lb (65.3 kg)  09/10/20 144 lb 6.4 oz (65.5 kg)    Physical Exam  Constitutional:  No distress.  hemodynamically stable  Neck: No JVD present.  Cardiovascular: Normal rate, regular rhythm, S1 normal and S2 normal. Exam reveals no gallop, no S3 and no S4.  No murmur heard. Pulmonary/Chest: Effort normal and breath sounds normal. No stridor. She has no wheezes. She has no rales.  Abdominal: Soft. Bowel sounds are normal. She exhibits no distension. There is no abdominal tenderness.  Musculoskeletal:        General: No edema.     Cervical back: Neck supple.  Neurological: She is alert and oriented to person, place, and time. She has intact cranial nerves (2-12).  Skin: Skin is warm.     Impression & Recommendation(s):  Impression:   ICD-10-CM   1. Precordial pain  R07.2 EKG 12-Lead    aspirin  EC 81 MG tablet    nitroGLYCERIN  (NITROSTAT ) 0.4 MG SL tablet    ECHOCARDIOGRAM COMPLETE    CT CORONARY MORPH W/CTA COR W/SCORE W/CA W/CM &/OR WO/CM    CANCELED: CT ANGIO CHEST AORTA W/CM & OR WO/CM    CANCELED: CT ANGIO CHEST AORTA W/CM & OR WO/CM    2. Mixed hyperlipidemia  E78.2     3. Pure hypertriglyceridemia  E78.1 aspirin  EC 81 MG tablet       Recommendation(s):  Precordial pain Her symptoms are not classic for anginal chest pain; however, females can present differently. Her cholesterol and triglycerides are not well-controlled. Given her symptoms, risk factors, and EKG recommend additional workup. Echo will be ordered to evaluate for structural heart disease and left ventricular systolic function. Coronary CTA to evaluate for CAC, obstructive disease, plaque burden Start aspirin  81 mg p.o. daily. Continue Crestor 10 mg p.o. nightly Sublingual nitroglycerin  tablets to use on a as needed basis Lopressor  25 mg p.o. twice daily 2 days prior to her coronary CTA. Patient plans to have repeat labs on 06/07/2023 with PCP. Educated her on seeking medical attention sooner by going to the closest ER via EMS if the symptoms increase in intensity, frequency, duration, or  has typical chest pain as discussed in the office.  Patient verbalized understanding.  Mixed hyperlipidemia Indexed labs from November 2024 notes LDL of 186 mg/dL Started on rosuvastatin 10 mg p.o. nightly by PCP in November 2024 and patient has repeat labs next week on 06/07/2023. Will await repeat labs prior to up titration of medical therapy at this time  Pure hypertriglyceridemia Index labs from November 2024 noted triglyceride levels of 244 mg/dL Since then has implemented lifestyle changes. Will have repeat labs next week with PCP  As part of today's office visit discussed management of acute symptom of chest pain, reviewed ER documentation from December 2024, ordered and independently reviewed EKG, independently reviewed labs from 03/06/2023, discussed management of at least 2 chronic comorbidities, coordination of care, ordered additional diagnostic workup as outlined above  Orders Placed:  Orders Placed This Encounter  Procedures   CT CORONARY MORPH W/CTA COR W/SCORE W/CA W/CM &/OR WO/CM    Standing Status:   Future    Expected Date:   06/10/2023    Expiration Date:   05/29/2024    Scheduling Instructions:     Please schedule after 06/07/23    If indicated for the ordered procedure, I authorize the administration of contrast media per Radiology protocol:   Yes    Initiate Coronary CTA Adult Protocol:   Yes    If indicated initiate Post Coronary CTA Hypotension Adult Protocol:   Yes    Does the patient have a contrast media/X-ray  dye allergy?:   No    Preferred Imaging Location?:   [facility]    Authorization::   FFR will be ordered if deemed medically necessary   EKG 12-Lead   ECHOCARDIOGRAM COMPLETE    Standing Status:   Future    Expiration Date:   05/29/2024    Where should this test be performed:   [facility])    Does the patient weigh less than or greater than 250 lbs?:   Patient weighs less than 250 lbs    Perflutren DEFINITY (image  enhancing agent) should be administered unless hypersensitivity or allergy exist:   Administer Perflutren    Reason for exam-Echo:   Other-Full Diagnosis List    Full ICD-10/Reason for Exam:   Precordial chest pain [813930]    Final Medication List:    Meds ordered this encounter  Medications   aspirin  EC 81 MG tablet    Sig: Take 1 tablet (81 mg total) by mouth daily. Swallow whole.   nitroGLYCERIN  (NITROSTAT ) 0.4 MG SL tablet    Sig: Place 1 tablet (0.4 mg total) under the tongue every 5 (five) minutes as needed for chest pain. You can only take a max of three tablets per chest pain episode    Dispense:  30 tablet    Refill:  0   metoprolol  tartrate (LOPRESSOR ) 25 MG tablet    Sig: Take 1 tablet (25 mg total) by mouth 2 (two) times daily. START two days before CTA. STOP when scan is complete.Take 90-120 minutes prior to scan. Hold for SBP less than 100 and/or heart rate less than 55.    Dispense:  5 tablet    Refill:  0    Medications Discontinued During This Encounter  Medication Reason   hydrOXYzine  (ATARAX /VISTARIL ) 25 MG tablet    traZODone  (DESYREL ) 100 MG tablet      Current Outpatient Medications:    aspirin  EC 81 MG tablet, Take 1 tablet (81 mg total) by mouth daily. Swallow whole., Disp: , Rfl:    metoprolol  tartrate (LOPRESSOR ) 25 MG tablet, Take 1 tablet (25 mg total) by mouth 2 (two) times daily. START two days before CTA. STOP when scan is complete.Take 90-120 minutes prior to scan. Hold for SBP less than 100 and/or heart rate less than 55., Disp: 5 tablet, Rfl: 0   nitroGLYCERIN  (NITROSTAT ) 0.4 MG SL tablet, Place 1 tablet (0.4 mg total) under the tongue every 5 (five) minutes as needed for chest pain. You can only take a max of three tablets per chest pain episode, Disp: 30 tablet, Rfl: 0   rosuvastatin (CRESTOR) 10 MG tablet, Take 10 mg by mouth daily., Disp: , Rfl:   Consent:   NA  Disposition:   8-week follow-up with APP to review test results and up titration  of medical therapy  6 months with myself  Patient may be asked to follow-up sooner based on the results of the above-mentioned testing.  Her questions and concerns were addressed to her satisfaction. She voices understanding of the recommendations provided during this encounter.    Signed, Madonna Large, DO, [facility] [place]  [facility]  [address] [place], [place] [address] 05/30/2023 10:38 AM

## 2023-05-30 NOTE — Patient Instructions (Addendum)
 Medication Instructions:  START Lopressor  25 mg one tablet twice daily two days before CTA. STOP after  CTA is complete START Aspiin 81mg  take one tablet by mouth daily  START nitroglycerin  for chest pain as needed up to three times per chest pain episode 5 minutes apart.  *If you need a refill on your cardiac medications before your next appointment, please call your pharmacy*   Lab Work: To be performed at primary care    Testing/Procedures: ECHO CTA   Your physician has requested that you have an echocardiogram. Echocardiography is a painless test that uses sound waves to create images of your heart. It provides your doctor with information about the size and shape of your heart and how well your heart's chambers and valves are working. This procedure takes approximately one hour. There are no restrictions for this procedure. Please do NOT wear cologne, perfume, aftershave, or lotions (deodorant is allowed). Please arrive 15 minutes prior to your appointment time.  Please note: We ask at that you not bring children with you during ultrasound (echo/ vascular) testing. Due to room size and safety concerns, children are not allowed in the ultrasound rooms during exams. Our front office staff cannot provide observation of children in our lobby area while testing is being conducted. An adult accompanying a patient to their appointment will only be allowed in the ultrasound room at the discretion of the ultrasound technician under special circumstances. We apologize for any inconvenience.   Cardiac CT Angiography (CTA), is a special type of CT scan that uses a computer to produce multi-dimensional views of major blood vessels throughout the body. In CT angiography, a contrast material is injected through an IV to help visualize the blood vessels    Follow-Up: At [facility], you and your health needs are our priority.  As part of our continuing mission to provide you with exceptional  heart care, we have created designated Provider Care Teams.  These Care Teams include your primary Cardiologist (physician) and Advanced Practice Providers (APPs -  Physician Assistants and Nurse Practitioners) who all work together to provide you with the care you need, when you need it.  We recommend signing up for the patient portal called MyChart.  Sign up information is provided on this After Visit Summary.  MyChart is used to connect with patients for Virtual Visits (Telemedicine).  Patients are able to view lab/test results, encounter notes, upcoming appointments, etc.  Non-urgent messages can be sent to your provider as well.   To learn more about what you can do with MyChart, go to [email or web address].    Your next appointment:   8 week(s)  Provider:   Orren Fabry, PA-C, Dayna Dunn, PA-C, Jackee Alberts, NP, Olivia Pavy, PA-C, Rosaline Bane, NP, Glendia Ferrier, PA-C, or Artist Pouch, PA-C     Then, Madonna Large, DO will plan to see you again in 6 month(s).    Other Instructions   Your cardiac CT will be scheduled at one of the below locations:   [facility] [address] [place], [place] [address] [phone]  If scheduled at [facility], please arrive at the [facility] and Children's Entrance (Entrance C2) of [facility] 30 minutes prior to test start time. You can use the FREE valet parking offered at entrance C (encouraged to control the heart rate for the test)  Proceed to the [facility] Radiology Department (first floor) to check-in and test prep.  All radiology patients and guests should  use entrance C2 at [facility], accessed from [facility], even though the hospital's physical address listed is [address]    If scheduled at [facility] or [facility], please arrive 15 mins early for check-in and test prep.  There is spacious parking and easy access to  the radiology department from the [facility] Heart and Vascular entrance. Please enter here and check-in with the desk attendant.   Please follow these instructions carefully (unless otherwise directed):  An IV will be required for this test and Nitroglycerin  will be given.  Hold all erectile dysfunction medications at least 3 days (72 hrs) prior to test. (Ie viagra, cialis, sildenafil, tadalafil, etc)   On the Night Before the Test: Be sure to Drink plenty of water. Do not consume any caffeinated/decaffeinated beverages or chocolate 12 hours prior to your test. Do not take any antihistamines 12 hours prior to your test.  On the Day of the Test: Drink plenty of water until 1 hour prior to the test. Do not eat any food 1 hour prior to test. You may take your regular medications prior to the test.  Take metoprolol  (Lopressor ) two hours prior to test. If you take Furosemide/Hydrochlorothiazide/Spironolactone, please HOLD on the morning of the test. FEMALES- please wear underwire-free bra if available, avoid dresses & tight clothing      After the Test: Drink plenty of water. After receiving IV contrast, you may experience a mild flushed feeling. This is normal. On occasion, you may experience a mild rash up to 24 hours after the test. This is not dangerous. If this occurs, you can take Benadryl 25 mg and increase your fluid intake. If you experience trouble breathing, this can be serious. If it is severe call 911 IMMEDIATELY. If it is mild, please call our office. If you take any of these medications: Glipizide/Metformin, Avandament, Glucavance, please do not take 48 hours after completing test unless otherwise instructed.  We will call to schedule your test 2-4 weeks out understanding that some insurance companies will need an authorization prior to the service being performed.   For more information and frequently asked questions, please visit our website :  [email or web address]  For non-scheduling related questions, please contact the cardiac imaging nurse navigator should you have any questions/concerns: Cardiac Imaging Nurse Navigators Direct Office Dial: [phone]   For scheduling needs, including cancellations and rescheduling, please call Brittany, [phone].     1st Floor: - Lobby - Registration  - Pharmacy  - Lab - Cafe  2nd Floor: - PV Lab - Diagnostic Testing (echo, CT, nuclear med)  3rd Floor: - Vacant  4th Floor: - TCTS (cardiothoracic surgery) - AFib Clinic - Structural Heart Clinic - Vascular Surgery  - Vascular Ultrasound  5th Floor: - HeartCare Cardiology (general and EP) - Clinical Pharmacy for coumadin, hypertension, lipid, weight-loss medications, and med management appointments    Valet parking services will be available as well.

## 2023-06-07 DIAGNOSIS — E785 Hyperlipidemia, unspecified: Secondary | ICD-10-CM | POA: Diagnosis not present

## 2023-06-07 DIAGNOSIS — Z23 Encounter for immunization: Secondary | ICD-10-CM | POA: Diagnosis not present

## 2023-06-12 ENCOUNTER — Ambulatory Visit (HOSPITAL_COMMUNITY): Payer: Medicare Other

## 2023-06-19 ENCOUNTER — Ambulatory Visit (HOSPITAL_COMMUNITY): Payer: Medicare Other | Attending: Cardiology

## 2023-06-19 DIAGNOSIS — R072 Precordial pain: Secondary | ICD-10-CM

## 2023-06-19 LAB — ECHOCARDIOGRAM COMPLETE
Area-P 1/2: 3.47 cm2
S' Lateral: 2.4 cm

## 2023-06-23 ENCOUNTER — Encounter: Payer: Self-pay | Admitting: Cardiology

## 2023-06-25 NOTE — Telephone Encounter (Signed)
 Pt daughter Gardiner Rhyme called in for results

## 2023-07-08 ENCOUNTER — Telehealth (HOSPITAL_COMMUNITY): Payer: Self-pay | Admitting: Emergency Medicine

## 2023-07-08 NOTE — Telephone Encounter (Signed)
 Reaching out to patient to offer assistance regarding upcoming cardiac imaging study; pt verbalizes understanding of appt date/time, parking situation and where to check in, pre-test NPO status and medications ordered, and verified current allergies; name and call back number provided for further questions should they arise Rockwell [place] RN Navigator Cardiac Imaging [facility] [phone] office [phone] cell

## 2023-07-10 ENCOUNTER — Ambulatory Visit (HOSPITAL_COMMUNITY)
Admission: RE | Admit: 2023-07-10 | Discharge: 2023-07-10 | Disposition: A | Discharge status: Home / Self Care | Source: Ambulatory Visit | Attending: Cardiology | Admitting: Cardiology

## 2023-07-10 DIAGNOSIS — R072 Precordial pain: Secondary | ICD-10-CM | POA: Diagnosis not present

## 2023-07-10 MED ORDER — IOHEXOL 350 MG/ML SOLN
95.0000 mL | Freq: Once | INTRAVENOUS | Status: AC | PRN
  Administered 2023-07-10: 95 mL via INTRAVENOUS

## 2023-07-10 MED ORDER — NITROGLYCERIN 0.4 MG SL SUBL
SUBLINGUAL_TABLET | SUBLINGUAL | Status: AC
Start: 2023-07-10 — End: ?
  Filled 2023-07-10: qty 2

## 2023-07-10 MED ORDER — NITROGLYCERIN 0.4 MG SL SUBL
0.8000 mg | SUBLINGUAL_TABLET | Freq: Once | SUBLINGUAL | Status: AC
  Administered 2023-07-10: 0.4 mg via SUBLINGUAL

## 2023-07-10 NOTE — Progress Notes (Signed)
 Per MD Tobb, given lower SBP, this RN is giving second 250cc bolus of fluid prior to scan. Will give one tablet of nitroglycerin instead of two at time of scan.

## 2023-07-17 ENCOUNTER — Encounter: Payer: Self-pay | Admitting: Cardiology

## 2023-07-25 ENCOUNTER — Telehealth: Payer: Self-pay | Admitting: Internal Medicine

## 2023-07-25 NOTE — Telephone Encounter (Signed)
 I received a critical result page from the radiology reading room regarding a coronary CTA that was performed on 07/10/23.    Attending radiologist was overreading the scan and found that patient has multiple acute segmental pulmonary emboli with biventricular dilation.    I attempted to call the patient at the number listed in the chart, however there was no answer.    I will forward message along to the ordering provider so they can follow up with the patient.

## 2023-07-27 NOTE — Progress Notes (Signed)
 Exam Date 07/10/2023  Cardiology read was conveyed to the patient in March 2025.  Radiology over-read on 07/25/2023 reported multiple segmental and subsegmental PE.  Was not sure if patient/family were notified of the results. So I called the daughter (Ms. Carlynn Purl) today on 07/27/2023 at 1:40pm   Daughter informs me that patient is at baseline and visiting her son in [place] for his wedding. I advised the daughter to have the patient go to the closest ER in [place] to have a repeat CT scan to evaluate the blot burden since the index study was in March 2025. Based on the results of the new scan she should be started on treatment (I.e. blood thinners) prior [place]. [place] she need to follow up w/ PCP +/- pulmonary medicine for management. Daughter is aware that patient needs to make sure she has had age appropriate cancer screening.   I will copy her PCP on the message to help coordinate care when patient follows up (@Dr . Alejandra).   I will have my office reach out to the daughter next week to make sure patient has received appropriate care and address any questions if possible (@Peyton ).   Jarmon Javid [place], DO, [facility]

## 2023-07-27 NOTE — Telephone Encounter (Signed)
 Please see the RN for CCTA - I spoke to the daughter over the phone on Saturday. Please call and make sure the patient is done well and that daughter has no additional questions.   Loui [place], DO, [facility]

## 2023-07-29 ENCOUNTER — Telehealth: Payer: Self-pay | Admitting: Cardiology

## 2023-07-29 NOTE — Telephone Encounter (Signed)
 Daughter states Dr Odis Hollingshead called and said Betty Bradley has blood clots. Daughter said Betty Bradley is out of the country and they need copy of CT faxed ASAP to be able to treat her.

## 2023-07-29 NOTE — Telephone Encounter (Signed)
 Betty Bradley, Please reach out to the daughter.   Please have the daughter arrange a repeat CT PE protocol study in [place] as the the clot burden may have worsened and treatment can be different if that is the case.   She should not fly back (long flight) without being re-evaluated in [place] as PE can be life-threatening.   [place] Yepiz Denham, DO, [facility]

## 2023-07-29 NOTE — Telephone Encounter (Signed)
 I fax a release of information for Betty Bradley to sign and fax back to me.  It is to release information to her daughter, Betty Bradley.  I told Ms.Betty Bradley that I will send her the last office note, medication list and the Cardia CT report.

## 2023-07-29 NOTE — Telephone Encounter (Signed)
 Patient's daughter, Joellen [place], sent back the signed release of information. I compared signature to two others and they matched.  I sent Ms. Lyndee Herbst last office note, Cardiac CT report and medication list via e-mail.

## 2023-07-29 NOTE — Telephone Encounter (Signed)
 Spoke with daughter and she is aware provider recommends she be evaluated in [place] for PE.Betty Bradley She verbalized understanding

## 2023-07-29 NOTE — Telephone Encounter (Signed)
 Refer to telephone note from 4/3.

## 2023-07-29 NOTE — Telephone Encounter (Signed)
 Daughter called and stated she did not take her mom to the ED. They are trying to figure out what will be the best option for her. She would like to know if you can tell them what medication and mg you would like for her to start.   A medical release form is also being emailed to them

## 2023-07-29 NOTE — Telephone Encounter (Signed)
 That is fine. But the patient has MyChart access so should have access to the results as well.   Shetara Launer [place], DO, [facility]

## 2023-07-29 NOTE — Telephone Encounter (Addendum)
Duplicate encounter. Please see previous encounter.  

## 2023-07-30 ENCOUNTER — Ambulatory Visit: Payer: Medicare Other | Admitting: Physician Assistant

## 2023-08-16 ENCOUNTER — Telehealth: Payer: Self-pay | Admitting: Cardiology

## 2023-08-16 NOTE — Telephone Encounter (Signed)
 Patient's daughter called and said that the patient would like to transfer from Dr. Albert Huff to either Dr. Abel Hoe or Dr. Veryl Gottron. Please confirm transfer

## 2023-08-16 NOTE — Telephone Encounter (Signed)
 Sure.  Betty Lerner, DO, [facility]

## 2023-08-19 NOTE — Progress Notes (Deleted)
   Cardiology Office Note    Date:  08/19/2023  ID:  Anyjah, Cundiff 04/15/1956, MRN [id number] PCP:  Karlton Overly, DO  Cardiologist:  Olinda Bertrand, DO  Electrophysiologist:  None   Chief Complaint: ***  History of Present Illness: Aaron Aas    Myonna Goth is a 68 y.o. female with visit-pertinent history of pre-DM, HLD, hypertriglyceridemia seen for follow-up. She established care 05/2023 with Dr. Albert Huff for chest pain. She had ED visit 03/2023 with negative d-dimer and negative troponins. Echo 06/19/23 showed EF 55-60%, mild MR.  Cor CTA 07/10/23 showed calcium score of zero without evidence of CAD. The radiology overread returned 07/25/23 noting multiple segmental and subsegmental pulmonary emboli in the right middle and lower lobes and the left lower lobe and lingula. At the time of study report, when Dr. Albert Huff conveyed results to patient/family, her daughter reported she was currently in [place] and was advised to proceed to ED there. F/u with PCP/pulmonology was also encouraged upon return to the states.   F/u PCP labs Cancer screening anticoag  Noncardiac chest pain, pulmonary embolism Hyperlipidemia, hypertriglyceridemia Mild MR  Labwork independently reviewed: 03/2023 trops neg, d-dimer neg, CBC wnl, K 3.8, Cr 0.73 Dr. Jesus Morones note references lipid 02/2023 LDL 186 2017 LDL 125, trig 135  ROS: .    Please see the history of present illness. Otherwise, review of systems is positive for ***.  All other systems are reviewed and otherwise negative.  Studies Reviewed: Aaron Aas    EKG:  EKG is ordered today, personally reviewed, demonstrating ***  CV Studies: Cardiac studies reviewed are outlined and summarized above. Otherwise please see EMR for full report.   Current Reported Medications:.    No outpatient medications have been marked as taking for the 08/20/23 encounter (Appointment) with [place] N, PA-C.    Physical Exam:    VS:  There were no vitals taken for this visit.    Wt Readings from Last 3 Encounters:  05/30/23 150 lb 6.4 oz (68.2 kg)  04/06/23 144 lb (65.3 kg)  09/10/20 144 lb 6.4 oz (65.5 kg)    GEN: Well nourished, well developed in no acute distress NECK: No JVD; No carotid bruits CARDIAC: ***RRR, no murmurs, rubs, gallops RESPIRATORY:  Clear to auscultation without rales, wheezing or rhonchi  ABDOMEN: Soft, non-tender, non-distended EXTREMITIES:  No edema; No acute deformity   Asessement and Plan:.     ***     Disposition: F/u with ***  Signed, Artavis Cowie N Khaylee Mcevoy, PA-C

## 2023-08-20 ENCOUNTER — Ambulatory Visit: Admitting: Physician Assistant

## 2023-09-23 ENCOUNTER — Ambulatory Visit: Admitting: Emergency Medicine

## 2023-10-30 DIAGNOSIS — H353131 Nonexudative age-related macular degeneration, bilateral, early dry stage: Secondary | ICD-10-CM | POA: Diagnosis not present

## 2023-10-30 DIAGNOSIS — H35363 Drusen (degenerative) of macula, bilateral: Secondary | ICD-10-CM | POA: Diagnosis not present

## 2023-11-05 ENCOUNTER — Other Ambulatory Visit: Payer: No Typology Code available for payment source

## 2023-11-20 ENCOUNTER — Ambulatory Visit (HOSPITAL_BASED_OUTPATIENT_CLINIC_OR_DEPARTMENT_OTHER)
Admission: RE | Admit: 2023-11-20 | Discharge: 2023-11-20 | Disposition: A | Discharge status: Home / Self Care | Source: Ambulatory Visit | Attending: Family Medicine | Admitting: Family Medicine

## 2023-11-20 DIAGNOSIS — M8589 Other specified disorders of bone density and structure, multiple sites: Secondary | ICD-10-CM | POA: Diagnosis not present

## 2023-11-20 DIAGNOSIS — Z13820 Encounter for screening for osteoporosis: Secondary | ICD-10-CM | POA: Insufficient documentation

## 2023-11-20 DIAGNOSIS — Z780 Asymptomatic menopausal state: Secondary | ICD-10-CM | POA: Insufficient documentation

## 2023-12-04 DIAGNOSIS — Z87898 Personal history of other specified conditions: Secondary | ICD-10-CM | POA: Diagnosis not present

## 2023-12-04 DIAGNOSIS — E785 Hyperlipidemia, unspecified: Secondary | ICD-10-CM | POA: Diagnosis not present

## 2023-12-04 DIAGNOSIS — Z86711 Personal history of pulmonary embolism: Secondary | ICD-10-CM | POA: Diagnosis not present

## 2024-03-11 DIAGNOSIS — Z0000 Encounter for general adult medical examination without abnormal findings: Secondary | ICD-10-CM | POA: Diagnosis not present

## 2024-03-11 DIAGNOSIS — Z86711 Personal history of pulmonary embolism: Secondary | ICD-10-CM | POA: Diagnosis not present

## 2024-03-11 DIAGNOSIS — E782 Mixed hyperlipidemia: Secondary | ICD-10-CM | POA: Diagnosis not present

## 2024-03-11 DIAGNOSIS — R7303 Prediabetes: Secondary | ICD-10-CM | POA: Diagnosis not present
